# Patient Record
Sex: Male | Born: 1955 | Race: Black or African American | Hispanic: No | Marital: Single | Smoking: Current every day smoker
Health system: Southern US, Community
[De-identification: ages and names within clinical notes are randomized; demographics above are authoritative.]

## PROBLEM LIST (undated history)

## (undated) DIAGNOSIS — F209 Schizophrenia, unspecified: Secondary | ICD-10-CM

## (undated) DIAGNOSIS — I517 Cardiomegaly: Secondary | ICD-10-CM

## (undated) DIAGNOSIS — E785 Hyperlipidemia, unspecified: Secondary | ICD-10-CM

## (undated) DIAGNOSIS — J45909 Unspecified asthma, uncomplicated: Secondary | ICD-10-CM

## (undated) DIAGNOSIS — I10 Essential (primary) hypertension: Secondary | ICD-10-CM

---

## 2015-08-13 DIAGNOSIS — M159 Polyosteoarthritis, unspecified: Secondary | ICD-10-CM | POA: Diagnosis not present

## 2015-08-13 DIAGNOSIS — I10 Essential (primary) hypertension: Secondary | ICD-10-CM | POA: Diagnosis not present

## 2015-08-13 DIAGNOSIS — Z6834 Body mass index (BMI) 34.0-34.9, adult: Secondary | ICD-10-CM | POA: Diagnosis not present

## 2015-08-13 DIAGNOSIS — E119 Type 2 diabetes mellitus without complications: Secondary | ICD-10-CM | POA: Diagnosis not present

## 2015-08-13 DIAGNOSIS — E669 Obesity, unspecified: Secondary | ICD-10-CM | POA: Diagnosis not present

## 2015-08-31 DIAGNOSIS — R7303 Prediabetes: Secondary | ICD-10-CM | POA: Diagnosis not present

## 2015-08-31 DIAGNOSIS — H2513 Age-related nuclear cataract, bilateral: Secondary | ICD-10-CM | POA: Diagnosis not present

## 2015-08-31 DIAGNOSIS — H524 Presbyopia: Secondary | ICD-10-CM | POA: Diagnosis not present

## 2015-10-11 DIAGNOSIS — I10 Essential (primary) hypertension: Secondary | ICD-10-CM | POA: Diagnosis not present

## 2015-10-11 DIAGNOSIS — E119 Type 2 diabetes mellitus without complications: Secondary | ICD-10-CM | POA: Diagnosis not present

## 2015-10-11 DIAGNOSIS — E669 Obesity, unspecified: Secondary | ICD-10-CM | POA: Diagnosis not present

## 2015-10-11 DIAGNOSIS — Z6835 Body mass index (BMI) 35.0-35.9, adult: Secondary | ICD-10-CM | POA: Diagnosis not present

## 2015-10-11 DIAGNOSIS — M159 Polyosteoarthritis, unspecified: Secondary | ICD-10-CM | POA: Diagnosis not present

## 2015-11-13 DIAGNOSIS — M159 Polyosteoarthritis, unspecified: Secondary | ICD-10-CM | POA: Diagnosis not present

## 2015-11-13 DIAGNOSIS — I10 Essential (primary) hypertension: Secondary | ICD-10-CM | POA: Diagnosis not present

## 2015-11-13 DIAGNOSIS — Z1211 Encounter for screening for malignant neoplasm of colon: Secondary | ICD-10-CM | POA: Diagnosis not present

## 2015-11-13 DIAGNOSIS — E669 Obesity, unspecified: Secondary | ICD-10-CM | POA: Diagnosis not present

## 2015-11-13 DIAGNOSIS — E119 Type 2 diabetes mellitus without complications: Secondary | ICD-10-CM | POA: Diagnosis not present

## 2015-11-13 DIAGNOSIS — Z6835 Body mass index (BMI) 35.0-35.9, adult: Secondary | ICD-10-CM | POA: Diagnosis not present

## 2015-11-28 DIAGNOSIS — E119 Type 2 diabetes mellitus without complications: Secondary | ICD-10-CM | POA: Diagnosis not present

## 2015-11-28 DIAGNOSIS — Z1211 Encounter for screening for malignant neoplasm of colon: Secondary | ICD-10-CM | POA: Diagnosis not present

## 2015-11-28 DIAGNOSIS — E669 Obesity, unspecified: Secondary | ICD-10-CM | POA: Diagnosis not present

## 2015-11-28 DIAGNOSIS — I10 Essential (primary) hypertension: Secondary | ICD-10-CM | POA: Diagnosis not present

## 2015-11-28 DIAGNOSIS — Z6835 Body mass index (BMI) 35.0-35.9, adult: Secondary | ICD-10-CM | POA: Diagnosis not present

## 2015-11-28 DIAGNOSIS — M159 Polyosteoarthritis, unspecified: Secondary | ICD-10-CM | POA: Diagnosis not present

## 2015-11-28 DIAGNOSIS — Z01818 Encounter for other preprocedural examination: Secondary | ICD-10-CM | POA: Diagnosis not present

## 2015-12-07 DIAGNOSIS — D128 Benign neoplasm of rectum: Secondary | ICD-10-CM | POA: Diagnosis not present

## 2015-12-07 DIAGNOSIS — E119 Type 2 diabetes mellitus without complications: Secondary | ICD-10-CM | POA: Diagnosis not present

## 2015-12-07 DIAGNOSIS — D122 Benign neoplasm of ascending colon: Secondary | ICD-10-CM | POA: Diagnosis not present

## 2015-12-07 DIAGNOSIS — D126 Benign neoplasm of colon, unspecified: Secondary | ICD-10-CM | POA: Diagnosis not present

## 2015-12-07 DIAGNOSIS — D124 Benign neoplasm of descending colon: Secondary | ICD-10-CM | POA: Diagnosis not present

## 2015-12-07 DIAGNOSIS — Z79899 Other long term (current) drug therapy: Secondary | ICD-10-CM | POA: Diagnosis not present

## 2015-12-07 DIAGNOSIS — Z1211 Encounter for screening for malignant neoplasm of colon: Secondary | ICD-10-CM | POA: Diagnosis not present

## 2015-12-07 DIAGNOSIS — I10 Essential (primary) hypertension: Secondary | ICD-10-CM | POA: Diagnosis not present

## 2015-12-12 DIAGNOSIS — E669 Obesity, unspecified: Secondary | ICD-10-CM | POA: Diagnosis not present

## 2015-12-12 DIAGNOSIS — E119 Type 2 diabetes mellitus without complications: Secondary | ICD-10-CM | POA: Diagnosis not present

## 2015-12-12 DIAGNOSIS — I10 Essential (primary) hypertension: Secondary | ICD-10-CM | POA: Diagnosis not present

## 2015-12-12 DIAGNOSIS — M159 Polyosteoarthritis, unspecified: Secondary | ICD-10-CM | POA: Diagnosis not present

## 2015-12-12 DIAGNOSIS — Z6835 Body mass index (BMI) 35.0-35.9, adult: Secondary | ICD-10-CM | POA: Diagnosis not present

## 2016-02-11 DIAGNOSIS — M159 Polyosteoarthritis, unspecified: Secondary | ICD-10-CM | POA: Diagnosis not present

## 2016-02-11 DIAGNOSIS — E119 Type 2 diabetes mellitus without complications: Secondary | ICD-10-CM | POA: Diagnosis not present

## 2016-02-11 DIAGNOSIS — E669 Obesity, unspecified: Secondary | ICD-10-CM | POA: Diagnosis not present

## 2016-02-11 DIAGNOSIS — Z6835 Body mass index (BMI) 35.0-35.9, adult: Secondary | ICD-10-CM | POA: Diagnosis not present

## 2016-02-11 DIAGNOSIS — E785 Hyperlipidemia, unspecified: Secondary | ICD-10-CM | POA: Diagnosis not present

## 2016-02-11 DIAGNOSIS — I10 Essential (primary) hypertension: Secondary | ICD-10-CM | POA: Diagnosis not present

## 2016-05-13 DIAGNOSIS — M159 Polyosteoarthritis, unspecified: Secondary | ICD-10-CM | POA: Diagnosis not present

## 2016-05-13 DIAGNOSIS — Z6836 Body mass index (BMI) 36.0-36.9, adult: Secondary | ICD-10-CM | POA: Diagnosis not present

## 2016-05-13 DIAGNOSIS — Z23 Encounter for immunization: Secondary | ICD-10-CM | POA: Diagnosis not present

## 2016-05-13 DIAGNOSIS — E669 Obesity, unspecified: Secondary | ICD-10-CM | POA: Diagnosis not present

## 2016-05-13 DIAGNOSIS — E785 Hyperlipidemia, unspecified: Secondary | ICD-10-CM | POA: Diagnosis not present

## 2016-05-13 DIAGNOSIS — E119 Type 2 diabetes mellitus without complications: Secondary | ICD-10-CM | POA: Diagnosis not present

## 2016-05-13 DIAGNOSIS — I10 Essential (primary) hypertension: Secondary | ICD-10-CM | POA: Diagnosis not present

## 2016-08-20 DIAGNOSIS — M159 Polyosteoarthritis, unspecified: Secondary | ICD-10-CM | POA: Diagnosis not present

## 2016-08-20 DIAGNOSIS — E669 Obesity, unspecified: Secondary | ICD-10-CM | POA: Diagnosis not present

## 2016-08-20 DIAGNOSIS — E785 Hyperlipidemia, unspecified: Secondary | ICD-10-CM | POA: Diagnosis not present

## 2016-08-20 DIAGNOSIS — E119 Type 2 diabetes mellitus without complications: Secondary | ICD-10-CM | POA: Diagnosis not present

## 2016-08-20 DIAGNOSIS — I10 Essential (primary) hypertension: Secondary | ICD-10-CM | POA: Diagnosis not present

## 2016-08-20 DIAGNOSIS — Z6836 Body mass index (BMI) 36.0-36.9, adult: Secondary | ICD-10-CM | POA: Diagnosis not present

## 2016-09-12 DIAGNOSIS — H2513 Age-related nuclear cataract, bilateral: Secondary | ICD-10-CM | POA: Diagnosis not present

## 2016-09-12 DIAGNOSIS — E119 Type 2 diabetes mellitus without complications: Secondary | ICD-10-CM | POA: Diagnosis not present

## 2016-09-12 DIAGNOSIS — H524 Presbyopia: Secondary | ICD-10-CM | POA: Diagnosis not present

## 2016-10-15 DIAGNOSIS — I10 Essential (primary) hypertension: Secondary | ICD-10-CM | POA: Diagnosis not present

## 2016-10-15 DIAGNOSIS — E119 Type 2 diabetes mellitus without complications: Secondary | ICD-10-CM | POA: Diagnosis not present

## 2016-10-15 DIAGNOSIS — E785 Hyperlipidemia, unspecified: Secondary | ICD-10-CM | POA: Diagnosis not present

## 2016-10-15 DIAGNOSIS — Z6836 Body mass index (BMI) 36.0-36.9, adult: Secondary | ICD-10-CM | POA: Diagnosis not present

## 2016-10-15 DIAGNOSIS — M159 Polyosteoarthritis, unspecified: Secondary | ICD-10-CM | POA: Diagnosis not present

## 2016-10-15 DIAGNOSIS — Z1389 Encounter for screening for other disorder: Secondary | ICD-10-CM | POA: Diagnosis not present

## 2016-10-15 DIAGNOSIS — E669 Obesity, unspecified: Secondary | ICD-10-CM | POA: Diagnosis not present

## 2017-01-12 DIAGNOSIS — Z6836 Body mass index (BMI) 36.0-36.9, adult: Secondary | ICD-10-CM | POA: Diagnosis not present

## 2017-01-12 DIAGNOSIS — E785 Hyperlipidemia, unspecified: Secondary | ICD-10-CM | POA: Diagnosis not present

## 2017-01-12 DIAGNOSIS — E119 Type 2 diabetes mellitus without complications: Secondary | ICD-10-CM | POA: Diagnosis not present

## 2017-01-12 DIAGNOSIS — I10 Essential (primary) hypertension: Secondary | ICD-10-CM | POA: Diagnosis not present

## 2017-01-12 DIAGNOSIS — E669 Obesity, unspecified: Secondary | ICD-10-CM | POA: Diagnosis not present

## 2017-01-12 DIAGNOSIS — M159 Polyosteoarthritis, unspecified: Secondary | ICD-10-CM | POA: Diagnosis not present

## 2017-02-11 DIAGNOSIS — E785 Hyperlipidemia, unspecified: Secondary | ICD-10-CM | POA: Diagnosis not present

## 2017-02-11 DIAGNOSIS — M159 Polyosteoarthritis, unspecified: Secondary | ICD-10-CM | POA: Diagnosis not present

## 2017-02-11 DIAGNOSIS — Z6836 Body mass index (BMI) 36.0-36.9, adult: Secondary | ICD-10-CM | POA: Diagnosis not present

## 2017-02-11 DIAGNOSIS — I10 Essential (primary) hypertension: Secondary | ICD-10-CM | POA: Diagnosis not present

## 2017-02-11 DIAGNOSIS — E669 Obesity, unspecified: Secondary | ICD-10-CM | POA: Diagnosis not present

## 2017-02-11 DIAGNOSIS — E119 Type 2 diabetes mellitus without complications: Secondary | ICD-10-CM | POA: Diagnosis not present

## 2017-04-14 DIAGNOSIS — M25551 Pain in right hip: Secondary | ICD-10-CM | POA: Diagnosis not present

## 2017-04-14 DIAGNOSIS — I10 Essential (primary) hypertension: Secondary | ICD-10-CM | POA: Diagnosis not present

## 2017-04-14 DIAGNOSIS — E785 Hyperlipidemia, unspecified: Secondary | ICD-10-CM | POA: Diagnosis not present

## 2017-04-14 DIAGNOSIS — E669 Obesity, unspecified: Secondary | ICD-10-CM | POA: Diagnosis not present

## 2017-04-14 DIAGNOSIS — Z6836 Body mass index (BMI) 36.0-36.9, adult: Secondary | ICD-10-CM | POA: Diagnosis not present

## 2017-04-14 DIAGNOSIS — E119 Type 2 diabetes mellitus without complications: Secondary | ICD-10-CM | POA: Diagnosis not present

## 2017-04-14 DIAGNOSIS — M159 Polyosteoarthritis, unspecified: Secondary | ICD-10-CM | POA: Diagnosis not present

## 2017-04-14 DIAGNOSIS — Z23 Encounter for immunization: Secondary | ICD-10-CM | POA: Diagnosis not present

## 2017-07-27 DIAGNOSIS — Z6837 Body mass index (BMI) 37.0-37.9, adult: Secondary | ICD-10-CM | POA: Diagnosis not present

## 2017-07-27 DIAGNOSIS — E785 Hyperlipidemia, unspecified: Secondary | ICD-10-CM | POA: Diagnosis not present

## 2017-07-27 DIAGNOSIS — E119 Type 2 diabetes mellitus without complications: Secondary | ICD-10-CM | POA: Diagnosis not present

## 2017-07-27 DIAGNOSIS — I10 Essential (primary) hypertension: Secondary | ICD-10-CM | POA: Diagnosis not present

## 2017-07-27 DIAGNOSIS — E669 Obesity, unspecified: Secondary | ICD-10-CM | POA: Diagnosis not present

## 2017-07-27 DIAGNOSIS — M159 Polyosteoarthritis, unspecified: Secondary | ICD-10-CM | POA: Diagnosis not present

## 2017-07-27 DIAGNOSIS — J208 Acute bronchitis due to other specified organisms: Secondary | ICD-10-CM | POA: Diagnosis not present

## 2017-08-11 DIAGNOSIS — I10 Essential (primary) hypertension: Secondary | ICD-10-CM | POA: Diagnosis not present

## 2017-08-11 DIAGNOSIS — M159 Polyosteoarthritis, unspecified: Secondary | ICD-10-CM | POA: Diagnosis not present

## 2017-08-11 DIAGNOSIS — Z6837 Body mass index (BMI) 37.0-37.9, adult: Secondary | ICD-10-CM | POA: Diagnosis not present

## 2017-08-11 DIAGNOSIS — E669 Obesity, unspecified: Secondary | ICD-10-CM | POA: Diagnosis not present

## 2017-08-11 DIAGNOSIS — E119 Type 2 diabetes mellitus without complications: Secondary | ICD-10-CM | POA: Diagnosis not present

## 2017-08-11 DIAGNOSIS — E785 Hyperlipidemia, unspecified: Secondary | ICD-10-CM | POA: Diagnosis not present

## 2017-08-11 DIAGNOSIS — R5382 Chronic fatigue, unspecified: Secondary | ICD-10-CM | POA: Diagnosis not present

## 2017-08-11 DIAGNOSIS — N189 Chronic kidney disease, unspecified: Secondary | ICD-10-CM | POA: Diagnosis not present

## 2017-09-14 DIAGNOSIS — H2513 Age-related nuclear cataract, bilateral: Secondary | ICD-10-CM | POA: Diagnosis not present

## 2017-09-14 DIAGNOSIS — H524 Presbyopia: Secondary | ICD-10-CM | POA: Diagnosis not present

## 2017-09-14 DIAGNOSIS — R7303 Prediabetes: Secondary | ICD-10-CM | POA: Diagnosis not present

## 2017-10-26 DIAGNOSIS — N189 Chronic kidney disease, unspecified: Secondary | ICD-10-CM | POA: Diagnosis not present

## 2017-10-26 DIAGNOSIS — E785 Hyperlipidemia, unspecified: Secondary | ICD-10-CM | POA: Diagnosis not present

## 2017-10-26 DIAGNOSIS — E669 Obesity, unspecified: Secondary | ICD-10-CM | POA: Diagnosis not present

## 2017-10-26 DIAGNOSIS — E291 Testicular hypofunction: Secondary | ICD-10-CM | POA: Diagnosis not present

## 2017-10-26 DIAGNOSIS — E119 Type 2 diabetes mellitus without complications: Secondary | ICD-10-CM | POA: Diagnosis not present

## 2017-10-26 DIAGNOSIS — Z6837 Body mass index (BMI) 37.0-37.9, adult: Secondary | ICD-10-CM | POA: Diagnosis not present

## 2017-10-26 DIAGNOSIS — I10 Essential (primary) hypertension: Secondary | ICD-10-CM | POA: Diagnosis not present

## 2017-10-26 DIAGNOSIS — Z1331 Encounter for screening for depression: Secondary | ICD-10-CM | POA: Diagnosis not present

## 2017-10-26 DIAGNOSIS — M159 Polyosteoarthritis, unspecified: Secondary | ICD-10-CM | POA: Diagnosis not present

## 2018-01-27 DIAGNOSIS — E669 Obesity, unspecified: Secondary | ICD-10-CM | POA: Diagnosis not present

## 2018-01-27 DIAGNOSIS — Z6837 Body mass index (BMI) 37.0-37.9, adult: Secondary | ICD-10-CM | POA: Diagnosis not present

## 2018-01-27 DIAGNOSIS — E119 Type 2 diabetes mellitus without complications: Secondary | ICD-10-CM | POA: Diagnosis not present

## 2018-01-27 DIAGNOSIS — M159 Polyosteoarthritis, unspecified: Secondary | ICD-10-CM | POA: Diagnosis not present

## 2018-01-27 DIAGNOSIS — N189 Chronic kidney disease, unspecified: Secondary | ICD-10-CM | POA: Diagnosis not present

## 2018-01-27 DIAGNOSIS — E785 Hyperlipidemia, unspecified: Secondary | ICD-10-CM | POA: Diagnosis not present

## 2018-01-27 DIAGNOSIS — I10 Essential (primary) hypertension: Secondary | ICD-10-CM | POA: Diagnosis not present

## 2018-01-27 DIAGNOSIS — E291 Testicular hypofunction: Secondary | ICD-10-CM | POA: Diagnosis not present

## 2018-02-02 ENCOUNTER — Emergency Department
Admission: EM | Admit: 2018-02-02 | Discharge: 2018-02-02 | Disposition: A | Discharge status: Home / Self Care | Payer: PPO | Attending: Emergency Medicine | Admitting: Emergency Medicine

## 2018-02-02 ENCOUNTER — Other Ambulatory Visit: Payer: Self-pay

## 2018-02-02 DIAGNOSIS — J45909 Unspecified asthma, uncomplicated: Secondary | ICD-10-CM | POA: Insufficient documentation

## 2018-02-02 DIAGNOSIS — E119 Type 2 diabetes mellitus without complications: Secondary | ICD-10-CM | POA: Diagnosis not present

## 2018-02-02 DIAGNOSIS — I10 Essential (primary) hypertension: Secondary | ICD-10-CM | POA: Diagnosis not present

## 2018-02-02 DIAGNOSIS — F17210 Nicotine dependence, cigarettes, uncomplicated: Secondary | ICD-10-CM | POA: Insufficient documentation

## 2018-02-02 DIAGNOSIS — R739 Hyperglycemia, unspecified: Secondary | ICD-10-CM | POA: Diagnosis present

## 2018-02-02 DIAGNOSIS — E1165 Type 2 diabetes mellitus with hyperglycemia: Secondary | ICD-10-CM | POA: Diagnosis not present

## 2018-02-02 HISTORY — DX: Schizophrenia, unspecified: F20.9

## 2018-02-02 HISTORY — DX: Hyperlipidemia, unspecified: E78.5

## 2018-02-02 HISTORY — DX: Unspecified asthma, uncomplicated: J45.909

## 2018-02-02 HISTORY — DX: Cardiomegaly: I51.7

## 2018-02-02 HISTORY — DX: Essential (primary) hypertension: I10

## 2018-02-02 LAB — URINALYSIS, COMPLETE (UACMP) WITH MICROSCOPIC
BACTERIA UA: NONE SEEN
Bilirubin Urine: NEGATIVE
Hgb urine dipstick: NEGATIVE
KETONES UR: 20 mg/dL — AB
Leukocytes, UA: NEGATIVE
Nitrite: NEGATIVE
PH: 6 (ref 5.0–8.0)
Protein, ur: NEGATIVE mg/dL
SPECIFIC GRAVITY, URINE: 1.033 — AB (ref 1.005–1.030)

## 2018-02-02 LAB — BASIC METABOLIC PANEL
Anion gap: 14 (ref 5–15)
BUN: 10 mg/dL (ref 8–23)
CHLORIDE: 94 mmol/L — AB (ref 98–111)
CO2: 24 mmol/L (ref 22–32)
CREATININE: 1.27 mg/dL — AB (ref 0.61–1.24)
Calcium: 10.2 mg/dL (ref 8.9–10.3)
GFR calc Af Amer: >60 mL/min (ref >60)
GFR calc non Af Amer: 59 mL/min — ABNORMAL LOW (ref >60)
GLUCOSE: 547 mg/dL — AB (ref 70–99)
Potassium: 4.5 mmol/L (ref 3.5–5.1)
SODIUM: 132 mmol/L — AB (ref 135–145)

## 2018-02-02 LAB — HEPATIC FUNCTION PANEL
ALBUMIN: 4.1 g/dL (ref 3.5–5.0)
ALT: 19 U/L (ref 0–44)
AST: 25 U/L (ref 15–41)
Alkaline Phosphatase: 117 U/L (ref 38–126)
BILIRUBIN DIRECT: 0.2 mg/dL (ref 0.0–0.2)
BILIRUBIN TOTAL: 1.2 mg/dL (ref 0.3–1.2)
Indirect Bilirubin: 1 mg/dL — ABNORMAL HIGH (ref 0.3–0.9)
Total Protein: 8 g/dL (ref 6.5–8.1)

## 2018-02-02 LAB — CBC
HEMATOCRIT: 43.4 % (ref 40.0–52.0)
Hemoglobin: 14.7 g/dL (ref 13.0–18.0)
MCH: 29.5 pg (ref 26.0–34.0)
MCHC: 33.8 g/dL (ref 32.0–36.0)
MCV: 87.2 fL (ref 80.0–100.0)
PLATELETS: 338 10*3/uL (ref 150–440)
RBC: 4.98 MIL/uL (ref 4.40–5.90)
RDW: 12.9 % (ref 11.5–14.5)
WBC: 9.2 10*3/uL (ref 3.8–10.6)

## 2018-02-02 LAB — GLUCOSE, CAPILLARY
GLUCOSE-CAPILLARY: 348 mg/dL — AB (ref 70–99)
Glucose-Capillary: 408 mg/dL — ABNORMAL HIGH (ref 70–99)

## 2018-02-02 MED ORDER — METFORMIN HCL 500 MG PO TABS: 500.0000 mg | ORAL_TABLET | Freq: Every day | ORAL | 0 refills | Status: AC

## 2018-02-02 MED ORDER — INSULIN ASPART 100 UNIT/ML ~~LOC~~ SOLN
5.0000 [IU] | Freq: Once | SUBCUTANEOUS | Status: AC
  Administered 2018-02-02: 5 [IU] via INTRAVENOUS
  Filled 2018-02-02: qty 1

## 2018-02-02 MED ORDER — SODIUM CHLORIDE 0.9 % IV BOLUS
1000.0000 mL | Freq: Once | INTRAVENOUS | Status: AC
  Administered 2018-02-02: 1000 mL via INTRAVENOUS

## 2018-02-02 NOTE — ED Notes (Signed)
Lab called Marliss CzarLinda McLamb to report BS - 547- Critical Results.  Patient to Room 19. Clinton SawyerKailey RN aware of BS results and room placement.

## 2018-02-02 NOTE — ED Provider Notes (Signed)
[facility] Emergency Department Provider Note ____________________________________________   First MD Initiated Contact with Patient 02/02/18 1056     (approximate)  I have reviewed the triage vital signs and the nursing notes.   HISTORY  Chief Complaint Hyperglycemia  HPI Jay Garrett is a 62 y.o. male presenting from his primary care doctor's office with an elevated glucose in the 500s.  The patient says that he has had associated urinary frequency as well as dry mouth and weakness over the past 2 weeks.  No previous history of diabetes but it does run in his family.  Denies any pain.  He does not report vomiting but is paperwork that accompanied him states that he has been vomiting now for 2 weeks.  Past Medical History:  Diagnosis Date  . Asthma   . Cardiomegaly   . Hyperlipemia   . Hypertension   . Schizophrenia (HCC)     There are no active problems to display for this patient.   History reviewed. No pertinent surgical history.  Prior to Admission medications   Not on File    Allergies Patient has no known allergies.  No family history on file.  Social History Social History   Tobacco Use  . Smoking status: Current Every Day Smoker    Types: Cigarettes  . Smokeless tobacco: Never Used  Substance Use Topics  . Alcohol use: Not Currently  . Drug use: Not Currently    Review of Systems  Constitutional: No fever/chills Eyes: No visual changes. ENT: No sore throat. Cardiovascular: Denies chest pain. Respiratory: Denies shortness of breath. Gastrointestinal: No abdominal pain. No diarrhea.  No constipation. Genitourinary: Negative for dysuria. Musculoskeletal: Negative for back pain. Skin: Negative for rash. Neurological: Negative for headaches, focal weakness or numbness.   ____________________________________________   PHYSICAL EXAM:  VITAL SIGNS: ED Triage Vitals  Enc Vitals Group     BP 02/02/18 1001 (!) 160/88       Pulse Rate 02/02/18 1001 63     Resp 02/02/18 1001 16     Temp 02/02/18 1001 97.7 F (36.5 C)     Temp Source 02/02/18 1001 Oral     SpO2 02/02/18 1001 100 %     Weight 02/02/18 1002 152 lb (68.9 kg)     Height 02/02/18 1002 5' 4.5" (1.638 m)     Head Circumference --      Peak Flow --      Pain Score 02/02/18 1001 0     Pain Loc --      Pain Edu? --      Excl. in [place]? --     Constitutional: Alert and oriented. Well appearing and in no acute distress. Eyes: Conjunctivae are normal.  Head: Atraumatic. Nose: No congestion/rhinnorhea. Mouth/Throat: Mucous membranes are mildly dry. Neck: No stridor.   Cardiovascular: Normal rate, regular rhythm. Grossly normal heart sounds.   Respiratory: Normal respiratory effort.  No retractions. Lungs CTAB. Gastrointestinal: Soft and nontender. No distention. No CVA tenderness. Musculoskeletal: No lower extremity tenderness nor edema.  No joint effusions. Neurologic:  Normal speech and language. No gross focal neurologic deficits are appreciated. Skin:  Skin is warm, dry and intact. No rash noted. Psychiatric: Mood and affect are normal. Speech and behavior are normal.  ____________________________________________   LABS (all labs ordered are listed, but only abnormal results are displayed)  Labs Reviewed  BASIC METABOLIC PANEL - Abnormal; Notable for the following components:      Result Value   Sodium  132 (*)    Chloride 94 (*)    Glucose, Bld 547 (*)    Creatinine, Ser 1.27 (*)    GFR calc non Af Amer 59 (*)    All other components within normal limits  URINALYSIS, COMPLETE (UACMP) WITH MICROSCOPIC - Abnormal; Notable for the following components:   Color, Urine YELLOW (*)    APPearance CLEAR (*)    Specific Gravity, Urine 1.033 (*)    Glucose, UA >=500 (*)    Ketones, ur 20 (*)    All other components within normal limits  HEPATIC FUNCTION PANEL - Abnormal; Notable for the following components:   Indirect Bilirubin 1.0 (*)     All other components within normal limits  GLUCOSE, CAPILLARY - Abnormal; Notable for the following components:   Glucose-Capillary 408 (*)    All other components within normal limits  GLUCOSE, CAPILLARY - Abnormal; Notable for the following components:   Glucose-Capillary 348 (*)    All other components within normal limits  CBC  CBG MONITORING, ED   ____________________________________________  EKG   ____________________________________________  RADIOLOGY   ____________________________________________   PROCEDURES  Procedure(s) performed:   Procedures  Critical Care performed:   ____________________________________________   INITIAL IMPRESSION / ASSESSMENT AND PLAN / ED COURSE  Pertinent labs & imaging results that were available during my care of the patient were reviewed by me and considered in my medical decision making (see chart for details).  DDX: Dehydration, new onset diabetes, DKA, hyperglycemia, renal failure As part of my medical decision making, I reviewed the following data within the electronic MEDICAL RECORD NUMBERNo previous records on file for review  ----------------------------------------- 2:19 PM on 02/02/2018 -----------------------------------------  Patient at this time without complaint.  Tolerating p.o. fluids.  Glucose of 3 5:48 aspart as well as IV fluids.  We will start the patient on metformin, 500 mg daily.  He will be discharged back to his group home at this time to follow-up with his primary care doctor. ____________________________________________   FINAL CLINICAL IMPRESSION(S) / ED DIAGNOSES  Diabetes.  Hyperglycemia.   NEW MEDICATIONS STARTED DURING THIS VISIT:  New Prescriptions   No medications on file     Note:  This document was prepared using Dragon voice recognition software and may include unintentional dictation errors.     Myrna BlazerSchaevitz, Carlisa Eble Matthew, MD 02/02/18 1420

## 2018-02-02 NOTE — ED Triage Notes (Signed)
Pt comes into the ED from [facility] with c/o 32lb wt loss on the past 2 months, with vomiting for the past 2 weeks, generalized weakness. States his CBG 519 with no hx of DM. Pt is here with manager from group home, "[facility]". [phone]/[phone]/Billy.

## 2018-02-02 NOTE — ED Notes (Signed)
First Nurse Note: Patient here with elevated BS - 571 at [facility].  Patient lives at [facility].  Alert.

## 2018-03-16 DIAGNOSIS — Z0000 Encounter for general adult medical examination without abnormal findings: Secondary | ICD-10-CM | POA: Diagnosis not present

## 2018-03-16 DIAGNOSIS — E669 Obesity, unspecified: Secondary | ICD-10-CM | POA: Diagnosis not present

## 2018-03-16 DIAGNOSIS — Z6837 Body mass index (BMI) 37.0-37.9, adult: Secondary | ICD-10-CM | POA: Diagnosis not present

## 2018-03-16 DIAGNOSIS — Z1331 Encounter for screening for depression: Secondary | ICD-10-CM | POA: Diagnosis not present

## 2018-03-16 DIAGNOSIS — Z125 Encounter for screening for malignant neoplasm of prostate: Secondary | ICD-10-CM | POA: Diagnosis not present

## 2018-03-16 DIAGNOSIS — Z1339 Encounter for screening examination for other mental health and behavioral disorders: Secondary | ICD-10-CM | POA: Diagnosis not present

## 2018-03-16 DIAGNOSIS — E785 Hyperlipidemia, unspecified: Secondary | ICD-10-CM | POA: Diagnosis not present

## 2018-05-13 DIAGNOSIS — N189 Chronic kidney disease, unspecified: Secondary | ICD-10-CM | POA: Diagnosis not present

## 2018-05-13 DIAGNOSIS — E785 Hyperlipidemia, unspecified: Secondary | ICD-10-CM | POA: Diagnosis not present

## 2018-05-13 DIAGNOSIS — I10 Essential (primary) hypertension: Secondary | ICD-10-CM | POA: Diagnosis not present

## 2018-05-13 DIAGNOSIS — Z6837 Body mass index (BMI) 37.0-37.9, adult: Secondary | ICD-10-CM | POA: Diagnosis not present

## 2018-05-13 DIAGNOSIS — E291 Testicular hypofunction: Secondary | ICD-10-CM | POA: Diagnosis not present

## 2018-05-13 DIAGNOSIS — E118 Type 2 diabetes mellitus with unspecified complications: Secondary | ICD-10-CM | POA: Diagnosis not present

## 2018-05-13 DIAGNOSIS — M159 Polyosteoarthritis, unspecified: Secondary | ICD-10-CM | POA: Diagnosis not present

## 2018-05-13 DIAGNOSIS — Z23 Encounter for immunization: Secondary | ICD-10-CM | POA: Diagnosis not present

## 2018-05-25 DIAGNOSIS — Z6837 Body mass index (BMI) 37.0-37.9, adult: Secondary | ICD-10-CM | POA: Diagnosis not present

## 2018-05-25 DIAGNOSIS — E785 Hyperlipidemia, unspecified: Secondary | ICD-10-CM | POA: Diagnosis not present

## 2018-05-25 DIAGNOSIS — N189 Chronic kidney disease, unspecified: Secondary | ICD-10-CM | POA: Diagnosis not present

## 2018-05-25 DIAGNOSIS — E291 Testicular hypofunction: Secondary | ICD-10-CM | POA: Diagnosis not present

## 2018-05-25 DIAGNOSIS — M159 Polyosteoarthritis, unspecified: Secondary | ICD-10-CM | POA: Diagnosis not present

## 2018-05-25 DIAGNOSIS — N183 Chronic kidney disease, stage 3 (moderate): Secondary | ICD-10-CM | POA: Diagnosis not present

## 2018-08-11 DIAGNOSIS — Z6837 Body mass index (BMI) 37.0-37.9, adult: Secondary | ICD-10-CM | POA: Diagnosis not present

## 2018-08-11 DIAGNOSIS — J069 Acute upper respiratory infection, unspecified: Secondary | ICD-10-CM | POA: Diagnosis not present

## 2018-08-11 DIAGNOSIS — R42 Dizziness and giddiness: Secondary | ICD-10-CM | POA: Diagnosis not present

## 2018-08-11 DIAGNOSIS — E785 Hyperlipidemia, unspecified: Secondary | ICD-10-CM | POA: Diagnosis not present

## 2018-08-11 DIAGNOSIS — E118 Type 2 diabetes mellitus with unspecified complications: Secondary | ICD-10-CM | POA: Diagnosis not present

## 2018-08-11 DIAGNOSIS — N183 Chronic kidney disease, stage 3 (moderate): Secondary | ICD-10-CM | POA: Diagnosis not present

## 2018-08-11 DIAGNOSIS — E291 Testicular hypofunction: Secondary | ICD-10-CM | POA: Diagnosis not present

## 2018-08-11 DIAGNOSIS — M159 Polyosteoarthritis, unspecified: Secondary | ICD-10-CM | POA: Diagnosis not present

## 2018-08-11 DIAGNOSIS — I10 Essential (primary) hypertension: Secondary | ICD-10-CM | POA: Diagnosis not present

## 2018-08-18 DIAGNOSIS — N183 Chronic kidney disease, stage 3 (moderate): Secondary | ICD-10-CM | POA: Diagnosis not present

## 2018-08-18 DIAGNOSIS — E118 Type 2 diabetes mellitus with unspecified complications: Secondary | ICD-10-CM | POA: Diagnosis not present

## 2018-08-18 DIAGNOSIS — Z6837 Body mass index (BMI) 37.0-37.9, adult: Secondary | ICD-10-CM | POA: Diagnosis not present

## 2018-08-18 DIAGNOSIS — E785 Hyperlipidemia, unspecified: Secondary | ICD-10-CM | POA: Diagnosis not present

## 2018-08-18 DIAGNOSIS — E669 Obesity, unspecified: Secondary | ICD-10-CM | POA: Diagnosis not present

## 2018-08-18 DIAGNOSIS — E291 Testicular hypofunction: Secondary | ICD-10-CM | POA: Diagnosis not present

## 2018-08-18 DIAGNOSIS — M159 Polyosteoarthritis, unspecified: Secondary | ICD-10-CM | POA: Diagnosis not present

## 2018-08-18 DIAGNOSIS — I10 Essential (primary) hypertension: Secondary | ICD-10-CM | POA: Diagnosis not present

## 2018-09-17 DIAGNOSIS — E785 Hyperlipidemia, unspecified: Secondary | ICD-10-CM | POA: Diagnosis not present

## 2018-09-17 DIAGNOSIS — E118 Type 2 diabetes mellitus with unspecified complications: Secondary | ICD-10-CM | POA: Diagnosis not present

## 2018-09-17 DIAGNOSIS — N183 Chronic kidney disease, stage 3 (moderate): Secondary | ICD-10-CM | POA: Diagnosis not present

## 2018-09-17 DIAGNOSIS — M159 Polyosteoarthritis, unspecified: Secondary | ICD-10-CM | POA: Diagnosis not present

## 2018-09-17 DIAGNOSIS — I10 Essential (primary) hypertension: Secondary | ICD-10-CM | POA: Diagnosis not present

## 2018-09-17 DIAGNOSIS — E291 Testicular hypofunction: Secondary | ICD-10-CM | POA: Diagnosis not present

## 2018-09-17 DIAGNOSIS — Z6837 Body mass index (BMI) 37.0-37.9, adult: Secondary | ICD-10-CM | POA: Diagnosis not present

## 2018-10-01 DIAGNOSIS — E118 Type 2 diabetes mellitus with unspecified complications: Secondary | ICD-10-CM | POA: Diagnosis not present

## 2018-10-01 DIAGNOSIS — E785 Hyperlipidemia, unspecified: Secondary | ICD-10-CM | POA: Diagnosis not present

## 2018-10-01 DIAGNOSIS — N183 Chronic kidney disease, stage 3 (moderate): Secondary | ICD-10-CM | POA: Diagnosis not present

## 2018-10-01 DIAGNOSIS — E291 Testicular hypofunction: Secondary | ICD-10-CM | POA: Diagnosis not present

## 2018-10-01 DIAGNOSIS — I10 Essential (primary) hypertension: Secondary | ICD-10-CM | POA: Diagnosis not present

## 2018-10-01 DIAGNOSIS — Z6837 Body mass index (BMI) 37.0-37.9, adult: Secondary | ICD-10-CM | POA: Diagnosis not present

## 2018-10-01 DIAGNOSIS — M159 Polyosteoarthritis, unspecified: Secondary | ICD-10-CM | POA: Diagnosis not present

## 2018-11-03 DIAGNOSIS — I480 Paroxysmal atrial fibrillation: Secondary | ICD-10-CM | POA: Diagnosis not present

## 2018-11-03 DIAGNOSIS — Z6836 Body mass index (BMI) 36.0-36.9, adult: Secondary | ICD-10-CM | POA: Diagnosis not present

## 2018-11-03 DIAGNOSIS — N183 Chronic kidney disease, stage 3 (moderate): Secondary | ICD-10-CM | POA: Diagnosis not present

## 2018-11-03 DIAGNOSIS — Z1212 Encounter for screening for malignant neoplasm of rectum: Secondary | ICD-10-CM | POA: Diagnosis not present

## 2018-11-03 DIAGNOSIS — I10 Essential (primary) hypertension: Secondary | ICD-10-CM | POA: Diagnosis not present

## 2018-11-03 DIAGNOSIS — E785 Hyperlipidemia, unspecified: Secondary | ICD-10-CM | POA: Diagnosis not present

## 2018-11-03 DIAGNOSIS — Z5181 Encounter for therapeutic drug level monitoring: Secondary | ICD-10-CM | POA: Diagnosis not present

## 2018-11-03 DIAGNOSIS — M159 Polyosteoarthritis, unspecified: Secondary | ICD-10-CM | POA: Diagnosis not present

## 2018-11-03 DIAGNOSIS — E118 Type 2 diabetes mellitus with unspecified complications: Secondary | ICD-10-CM | POA: Diagnosis not present

## 2018-11-03 DIAGNOSIS — M25511 Pain in right shoulder: Secondary | ICD-10-CM | POA: Diagnosis not present

## 2018-11-03 DIAGNOSIS — E291 Testicular hypofunction: Secondary | ICD-10-CM | POA: Diagnosis not present

## 2018-11-17 DIAGNOSIS — E118 Type 2 diabetes mellitus with unspecified complications: Secondary | ICD-10-CM | POA: Diagnosis not present

## 2018-11-17 DIAGNOSIS — E291 Testicular hypofunction: Secondary | ICD-10-CM | POA: Diagnosis not present

## 2018-11-17 DIAGNOSIS — E785 Hyperlipidemia, unspecified: Secondary | ICD-10-CM | POA: Diagnosis not present

## 2018-11-17 DIAGNOSIS — Z1331 Encounter for screening for depression: Secondary | ICD-10-CM | POA: Diagnosis not present

## 2018-11-17 DIAGNOSIS — N183 Chronic kidney disease, stage 3 (moderate): Secondary | ICD-10-CM | POA: Diagnosis not present

## 2018-11-17 DIAGNOSIS — I10 Essential (primary) hypertension: Secondary | ICD-10-CM | POA: Diagnosis not present

## 2018-11-17 DIAGNOSIS — M159 Polyosteoarthritis, unspecified: Secondary | ICD-10-CM | POA: Diagnosis not present

## 2018-11-17 DIAGNOSIS — Z6836 Body mass index (BMI) 36.0-36.9, adult: Secondary | ICD-10-CM | POA: Diagnosis not present

## 2018-12-07 DIAGNOSIS — E291 Testicular hypofunction: Secondary | ICD-10-CM | POA: Diagnosis not present

## 2018-12-07 DIAGNOSIS — E118 Type 2 diabetes mellitus with unspecified complications: Secondary | ICD-10-CM | POA: Diagnosis not present

## 2018-12-07 DIAGNOSIS — N183 Chronic kidney disease, stage 3 (moderate): Secondary | ICD-10-CM | POA: Diagnosis not present

## 2018-12-07 DIAGNOSIS — M25511 Pain in right shoulder: Secondary | ICD-10-CM | POA: Diagnosis not present

## 2018-12-07 DIAGNOSIS — I10 Essential (primary) hypertension: Secondary | ICD-10-CM | POA: Diagnosis not present

## 2018-12-07 DIAGNOSIS — E785 Hyperlipidemia, unspecified: Secondary | ICD-10-CM | POA: Diagnosis not present

## 2018-12-07 DIAGNOSIS — Z6835 Body mass index (BMI) 35.0-35.9, adult: Secondary | ICD-10-CM | POA: Diagnosis not present

## 2018-12-07 DIAGNOSIS — M159 Polyosteoarthritis, unspecified: Secondary | ICD-10-CM | POA: Diagnosis not present

## 2019-02-07 DIAGNOSIS — E118 Type 2 diabetes mellitus with unspecified complications: Secondary | ICD-10-CM | POA: Diagnosis not present

## 2019-02-07 DIAGNOSIS — E785 Hyperlipidemia, unspecified: Secondary | ICD-10-CM | POA: Diagnosis not present

## 2019-02-07 DIAGNOSIS — M159 Polyosteoarthritis, unspecified: Secondary | ICD-10-CM | POA: Diagnosis not present

## 2019-02-07 DIAGNOSIS — Z6836 Body mass index (BMI) 36.0-36.9, adult: Secondary | ICD-10-CM | POA: Diagnosis not present

## 2019-02-07 DIAGNOSIS — N183 Chronic kidney disease, stage 3 (moderate): Secondary | ICD-10-CM | POA: Diagnosis not present

## 2019-02-07 DIAGNOSIS — M25511 Pain in right shoulder: Secondary | ICD-10-CM | POA: Diagnosis not present

## 2019-02-07 DIAGNOSIS — I10 Essential (primary) hypertension: Secondary | ICD-10-CM | POA: Diagnosis not present

## 2019-02-07 DIAGNOSIS — E291 Testicular hypofunction: Secondary | ICD-10-CM | POA: Diagnosis not present

## 2019-03-22 DIAGNOSIS — E785 Hyperlipidemia, unspecified: Secondary | ICD-10-CM | POA: Diagnosis not present

## 2019-03-22 DIAGNOSIS — Z125 Encounter for screening for malignant neoplasm of prostate: Secondary | ICD-10-CM | POA: Diagnosis not present

## 2019-03-22 DIAGNOSIS — Z1331 Encounter for screening for depression: Secondary | ICD-10-CM | POA: Diagnosis not present

## 2019-03-22 DIAGNOSIS — Z0000 Encounter for general adult medical examination without abnormal findings: Secondary | ICD-10-CM | POA: Diagnosis not present

## 2019-03-22 DIAGNOSIS — Z6836 Body mass index (BMI) 36.0-36.9, adult: Secondary | ICD-10-CM | POA: Diagnosis not present

## 2019-03-22 DIAGNOSIS — Z9181 History of falling: Secondary | ICD-10-CM | POA: Diagnosis not present

## 2019-04-11 DIAGNOSIS — I10 Essential (primary) hypertension: Secondary | ICD-10-CM | POA: Diagnosis not present

## 2019-04-11 DIAGNOSIS — Z1331 Encounter for screening for depression: Secondary | ICD-10-CM | POA: Diagnosis not present

## 2019-04-11 DIAGNOSIS — E785 Hyperlipidemia, unspecified: Secondary | ICD-10-CM | POA: Diagnosis not present

## 2019-04-11 DIAGNOSIS — N183 Chronic kidney disease, stage 3 (moderate): Secondary | ICD-10-CM | POA: Diagnosis not present

## 2019-04-11 DIAGNOSIS — M159 Polyosteoarthritis, unspecified: Secondary | ICD-10-CM | POA: Diagnosis not present

## 2019-04-11 DIAGNOSIS — E291 Testicular hypofunction: Secondary | ICD-10-CM | POA: Diagnosis not present

## 2019-04-11 DIAGNOSIS — Z23 Encounter for immunization: Secondary | ICD-10-CM | POA: Diagnosis not present

## 2019-04-11 DIAGNOSIS — E118 Type 2 diabetes mellitus with unspecified complications: Secondary | ICD-10-CM | POA: Diagnosis not present

## 2019-04-11 DIAGNOSIS — Z6836 Body mass index (BMI) 36.0-36.9, adult: Secondary | ICD-10-CM | POA: Diagnosis not present

## 2019-06-10 DIAGNOSIS — E291 Testicular hypofunction: Secondary | ICD-10-CM | POA: Diagnosis not present

## 2019-06-10 DIAGNOSIS — E118 Type 2 diabetes mellitus with unspecified complications: Secondary | ICD-10-CM | POA: Diagnosis not present

## 2019-06-10 DIAGNOSIS — N529 Male erectile dysfunction, unspecified: Secondary | ICD-10-CM | POA: Diagnosis not present

## 2019-06-10 DIAGNOSIS — Z6836 Body mass index (BMI) 36.0-36.9, adult: Secondary | ICD-10-CM | POA: Diagnosis not present

## 2019-06-10 DIAGNOSIS — R6889 Other general symptoms and signs: Secondary | ICD-10-CM | POA: Diagnosis not present

## 2019-06-10 DIAGNOSIS — E785 Hyperlipidemia, unspecified: Secondary | ICD-10-CM | POA: Diagnosis not present

## 2019-06-10 DIAGNOSIS — Z20828 Contact with and (suspected) exposure to other viral communicable diseases: Secondary | ICD-10-CM | POA: Diagnosis not present

## 2019-06-10 DIAGNOSIS — I10 Essential (primary) hypertension: Secondary | ICD-10-CM | POA: Diagnosis not present

## 2019-06-10 DIAGNOSIS — M159 Polyosteoarthritis, unspecified: Secondary | ICD-10-CM | POA: Diagnosis not present

## 2019-06-10 DIAGNOSIS — N183 Chronic kidney disease, stage 3 unspecified: Secondary | ICD-10-CM | POA: Diagnosis not present

## 2019-06-14 DIAGNOSIS — U071 COVID-19: Secondary | ICD-10-CM | POA: Diagnosis not present

## 2019-06-14 DIAGNOSIS — Z6836 Body mass index (BMI) 36.0-36.9, adult: Secondary | ICD-10-CM | POA: Diagnosis not present

## 2019-06-14 DIAGNOSIS — I10 Essential (primary) hypertension: Secondary | ICD-10-CM | POA: Diagnosis not present

## 2019-06-14 DIAGNOSIS — E118 Type 2 diabetes mellitus with unspecified complications: Secondary | ICD-10-CM | POA: Diagnosis not present

## 2019-06-14 DIAGNOSIS — E785 Hyperlipidemia, unspecified: Secondary | ICD-10-CM | POA: Diagnosis not present

## 2019-06-14 DIAGNOSIS — N183 Chronic kidney disease, stage 3 unspecified: Secondary | ICD-10-CM | POA: Diagnosis not present

## 2019-06-14 DIAGNOSIS — E291 Testicular hypofunction: Secondary | ICD-10-CM | POA: Diagnosis not present

## 2019-06-14 DIAGNOSIS — M159 Polyosteoarthritis, unspecified: Secondary | ICD-10-CM | POA: Diagnosis not present

## 2019-06-14 DIAGNOSIS — N529 Male erectile dysfunction, unspecified: Secondary | ICD-10-CM | POA: Diagnosis not present

## 2019-06-28 DIAGNOSIS — U071 COVID-19: Secondary | ICD-10-CM | POA: Diagnosis not present

## 2019-06-28 DIAGNOSIS — M159 Polyosteoarthritis, unspecified: Secondary | ICD-10-CM | POA: Diagnosis not present

## 2019-06-28 DIAGNOSIS — N529 Male erectile dysfunction, unspecified: Secondary | ICD-10-CM | POA: Diagnosis not present

## 2019-06-28 DIAGNOSIS — E785 Hyperlipidemia, unspecified: Secondary | ICD-10-CM | POA: Diagnosis not present

## 2019-06-28 DIAGNOSIS — Z6836 Body mass index (BMI) 36.0-36.9, adult: Secondary | ICD-10-CM | POA: Diagnosis not present

## 2019-06-28 DIAGNOSIS — E291 Testicular hypofunction: Secondary | ICD-10-CM | POA: Diagnosis not present

## 2019-06-28 DIAGNOSIS — E118 Type 2 diabetes mellitus with unspecified complications: Secondary | ICD-10-CM | POA: Diagnosis not present

## 2019-06-28 DIAGNOSIS — I10 Essential (primary) hypertension: Secondary | ICD-10-CM | POA: Diagnosis not present

## 2019-06-28 DIAGNOSIS — N183 Chronic kidney disease, stage 3 unspecified: Secondary | ICD-10-CM | POA: Diagnosis not present

## 2019-07-12 DIAGNOSIS — N529 Male erectile dysfunction, unspecified: Secondary | ICD-10-CM | POA: Diagnosis not present

## 2019-07-12 DIAGNOSIS — Z6836 Body mass index (BMI) 36.0-36.9, adult: Secondary | ICD-10-CM | POA: Diagnosis not present

## 2019-07-12 DIAGNOSIS — N1831 Chronic kidney disease, stage 3a: Secondary | ICD-10-CM | POA: Diagnosis not present

## 2019-07-12 DIAGNOSIS — E291 Testicular hypofunction: Secondary | ICD-10-CM | POA: Diagnosis not present

## 2019-07-12 DIAGNOSIS — E118 Type 2 diabetes mellitus with unspecified complications: Secondary | ICD-10-CM | POA: Diagnosis not present

## 2019-07-12 DIAGNOSIS — I10 Essential (primary) hypertension: Secondary | ICD-10-CM | POA: Diagnosis not present

## 2019-07-12 DIAGNOSIS — E669 Obesity, unspecified: Secondary | ICD-10-CM | POA: Diagnosis not present

## 2019-07-12 DIAGNOSIS — E785 Hyperlipidemia, unspecified: Secondary | ICD-10-CM | POA: Diagnosis not present

## 2019-07-12 DIAGNOSIS — M159 Polyosteoarthritis, unspecified: Secondary | ICD-10-CM | POA: Diagnosis not present

## 2019-11-25 ENCOUNTER — Other Ambulatory Visit: Payer: Self-pay

## 2019-11-25 ENCOUNTER — Observation Stay
Admission: EM | Admit: 2019-11-25 | Discharge: 2019-11-27 | Disposition: A | Discharge status: Home / Self Care | Payer: PPO | Attending: Internal Medicine | Admitting: Internal Medicine

## 2019-11-25 ENCOUNTER — Observation Stay (HOSPITAL_BASED_OUTPATIENT_CLINIC_OR_DEPARTMENT_OTHER)
Admit: 2019-11-25 | Discharge: 2019-11-25 | Disposition: A | Discharge status: Home / Self Care | Payer: PPO | Attending: Internal Medicine | Admitting: Internal Medicine

## 2019-11-25 ENCOUNTER — Emergency Department: Payer: PPO

## 2019-11-25 DIAGNOSIS — Z79899 Other long term (current) drug therapy: Secondary | ICD-10-CM | POA: Insufficient documentation

## 2019-11-25 DIAGNOSIS — Z8673 Personal history of transient ischemic attack (TIA), and cerebral infarction without residual deficits: Secondary | ICD-10-CM | POA: Insufficient documentation

## 2019-11-25 DIAGNOSIS — E1129 Type 2 diabetes mellitus with other diabetic kidney complication: Secondary | ICD-10-CM | POA: Diagnosis present

## 2019-11-25 DIAGNOSIS — I10 Essential (primary) hypertension: Secondary | ICD-10-CM | POA: Diagnosis present

## 2019-11-25 DIAGNOSIS — E785 Hyperlipidemia, unspecified: Secondary | ICD-10-CM | POA: Insufficient documentation

## 2019-11-25 DIAGNOSIS — E1121 Type 2 diabetes mellitus with diabetic nephropathy: Secondary | ICD-10-CM | POA: Insufficient documentation

## 2019-11-25 DIAGNOSIS — G459 Transient cerebral ischemic attack, unspecified: Principal | ICD-10-CM | POA: Insufficient documentation

## 2019-11-25 DIAGNOSIS — N179 Acute kidney failure, unspecified: Secondary | ICD-10-CM | POA: Diagnosis not present

## 2019-11-25 DIAGNOSIS — G9341 Metabolic encephalopathy: Secondary | ICD-10-CM | POA: Diagnosis present

## 2019-11-25 DIAGNOSIS — F17210 Nicotine dependence, cigarettes, uncomplicated: Secondary | ICD-10-CM | POA: Diagnosis not present

## 2019-11-25 DIAGNOSIS — N1831 Chronic kidney disease, stage 3a: Secondary | ICD-10-CM | POA: Diagnosis not present

## 2019-11-25 DIAGNOSIS — I1310 Hypertensive heart and chronic kidney disease without heart failure, with stage 1 through stage 4 chronic kidney disease, or unspecified chronic kidney disease: Secondary | ICD-10-CM | POA: Diagnosis not present

## 2019-11-25 DIAGNOSIS — R4781 Slurred speech: Secondary | ICD-10-CM | POA: Diagnosis present

## 2019-11-25 DIAGNOSIS — F209 Schizophrenia, unspecified: Secondary | ICD-10-CM | POA: Insufficient documentation

## 2019-11-25 DIAGNOSIS — R29810 Facial weakness: Secondary | ICD-10-CM | POA: Diagnosis present

## 2019-11-25 DIAGNOSIS — J45909 Unspecified asthma, uncomplicated: Secondary | ICD-10-CM | POA: Diagnosis not present

## 2019-11-25 DIAGNOSIS — Z7984 Long term (current) use of oral hypoglycemic drugs: Secondary | ICD-10-CM | POA: Diagnosis not present

## 2019-11-25 DIAGNOSIS — Z7982 Long term (current) use of aspirin: Secondary | ICD-10-CM | POA: Diagnosis not present

## 2019-11-25 DIAGNOSIS — Z791 Long term (current) use of non-steroidal anti-inflammatories (NSAID): Secondary | ICD-10-CM | POA: Insufficient documentation

## 2019-11-25 DIAGNOSIS — I351 Nonrheumatic aortic (valve) insufficiency: Secondary | ICD-10-CM | POA: Diagnosis not present

## 2019-11-25 DIAGNOSIS — R4182 Altered mental status, unspecified: Secondary | ICD-10-CM | POA: Diagnosis present

## 2019-11-25 DIAGNOSIS — Z20822 Contact with and (suspected) exposure to covid-19: Secondary | ICD-10-CM | POA: Insufficient documentation

## 2019-11-25 DIAGNOSIS — Z03818 Encounter for observation for suspected exposure to other biological agents ruled out: Secondary | ICD-10-CM | POA: Diagnosis not present

## 2019-11-25 DIAGNOSIS — R404 Transient alteration of awareness: Secondary | ICD-10-CM | POA: Diagnosis not present

## 2019-11-25 DIAGNOSIS — R410 Disorientation, unspecified: Secondary | ICD-10-CM | POA: Diagnosis not present

## 2019-11-25 DIAGNOSIS — R0902 Hypoxemia: Secondary | ICD-10-CM | POA: Diagnosis not present

## 2019-11-25 DIAGNOSIS — R42 Dizziness and giddiness: Secondary | ICD-10-CM | POA: Diagnosis not present

## 2019-11-25 LAB — CBC WITH DIFFERENTIAL/PLATELET
Abs Immature Granulocytes: 0.04 K/uL (ref 0.00–0.07)
Basophils Absolute: 0.1 K/uL (ref 0.0–0.1)
Basophils Relative: 1 %
Eosinophils Absolute: 0.1 K/uL (ref 0.0–0.5)
Eosinophils Relative: 1 %
HCT: 50.2 % (ref 39.0–52.0)
Hemoglobin: 15.9 g/dL (ref 13.0–17.0)
Immature Granulocytes: 0 %
Lymphocytes Relative: 33 %
Lymphs Abs: 3.6 K/uL (ref 0.7–4.0)
MCH: 29.1 pg (ref 26.0–34.0)
MCHC: 31.7 g/dL (ref 30.0–36.0)
MCV: 91.9 fL (ref 80.0–100.0)
Monocytes Absolute: 0.7 K/uL (ref 0.1–1.0)
Monocytes Relative: 6 %
Neutro Abs: 6.4 K/uL (ref 1.7–7.7)
Neutrophils Relative %: 59 %
Platelets: 249 K/uL (ref 150–400)
RBC: 5.46 MIL/uL (ref 4.22–5.81)
RDW: 13.5 % (ref 11.5–15.5)
WBC: 10.9 K/uL — ABNORMAL HIGH (ref 4.0–10.5)
nRBC: 0 % (ref 0.0–0.2)

## 2019-11-25 LAB — BASIC METABOLIC PANEL WITH GFR
Anion gap: 12 (ref 5–15)
BUN: 11 mg/dL (ref 8–23)
CO2: 24 mmol/L (ref 22–32)
Calcium: 9.6 mg/dL (ref 8.9–10.3)
Chloride: 104 mmol/L (ref 98–111)
Creatinine, Ser: 1.42 mg/dL — ABNORMAL HIGH (ref 0.61–1.24)
GFR calc Af Amer: >60 mL/min
GFR calc non Af Amer: 52 mL/min — ABNORMAL LOW
Glucose, Bld: 127 mg/dL — ABNORMAL HIGH (ref 70–99)
Potassium: 4.8 mmol/L (ref 3.5–5.1)
Sodium: 140 mmol/L (ref 135–145)

## 2019-11-25 LAB — GLUCOSE, CAPILLARY
Glucose-Capillary: 91 mg/dL (ref 70–99)
Glucose-Capillary: 85 mg/dL (ref 70–99)
Glucose-Capillary: 87 mg/dL (ref 70–99)

## 2019-11-25 LAB — TROPONIN I (HIGH SENSITIVITY): Troponin I (High Sensitivity): 6 ng/L (ref <18)

## 2019-11-25 LAB — ECHOCARDIOGRAM COMPLETE
Height: 66 in
Weight: 2720 oz

## 2019-11-25 LAB — BRAIN NATRIURETIC PEPTIDE: B Natriuretic Peptide: 42 pg/mL (ref 0.0–100.0)

## 2019-11-25 LAB — SARS CORONAVIRUS 2 (TAT 6-24 HRS): SARS Coronavirus 2: NEGATIVE

## 2019-11-25 MED ORDER — ACETAMINOPHEN 325 MG PO TABS: 650.0000 mg | ORAL_TABLET | Freq: Four times a day (QID) | ORAL | Status: DC | PRN

## 2019-11-25 MED ORDER — ALBUTEROL SULFATE (2.5 MG/3ML) 0.083% IN NEBU
2.5000 mg | INHALATION_SOLUTION | RESPIRATORY_TRACT | Status: DC | PRN
  Filled 2019-11-25 (×2): qty 3

## 2019-11-25 MED ORDER — DIVALPROEX SODIUM 500 MG PO DR TAB
500.0000 mg | DELAYED_RELEASE_TABLET | Freq: Every day | ORAL | Status: DC
  Administered 2019-11-25 – 2019-11-26 (×2): 500 mg via ORAL
  Filled 2019-11-25 (×3): qty 1

## 2019-11-25 MED ORDER — FLUTICASONE PROPIONATE HFA 220 MCG/ACT IN AERO: 2.0000 | INHALATION_SPRAY | Freq: Two times a day (BID) | RESPIRATORY_TRACT | Status: DC

## 2019-11-25 MED ORDER — BUDESONIDE 0.5 MG/2ML IN SUSP
0.5000 mg | Freq: Two times a day (BID) | RESPIRATORY_TRACT | Status: DC
  Administered 2019-11-26 – 2019-11-27 (×3): 0.5 mg via RESPIRATORY_TRACT
  Filled 2019-11-25 (×3): qty 2

## 2019-11-25 MED ORDER — SODIUM CHLORIDE 0.9 % IV SOLN: INTRAVENOUS | Status: DC

## 2019-11-25 MED ORDER — BUPROPION HCL ER (XL) 150 MG PO TB24
150.0000 mg | ORAL_TABLET | Freq: Every day | ORAL | Status: DC
  Administered 2019-11-26 – 2019-11-27 (×2): 150 mg via ORAL
  Filled 2019-11-25 (×2): qty 1

## 2019-11-25 MED ORDER — PANTOPRAZOLE SODIUM 40 MG PO TBEC
40.0000 mg | DELAYED_RELEASE_TABLET | Freq: Every day | ORAL | Status: DC
  Administered 2019-11-25 – 2019-11-27 (×3): 40 mg via ORAL
  Filled 2019-11-25 (×3): qty 1

## 2019-11-25 MED ORDER — LORATADINE 10 MG PO TABS
10.0000 mg | ORAL_TABLET | Freq: Every day | ORAL | Status: DC
  Administered 2019-11-26 – 2019-11-27 (×2): 10 mg via ORAL
  Filled 2019-11-25 (×2): qty 1

## 2019-11-25 MED ORDER — DIVALPROEX SODIUM 250 MG PO DR TAB
250.0000 mg | DELAYED_RELEASE_TABLET | Freq: Every morning | ORAL | Status: DC
  Administered 2019-11-26 – 2019-11-27 (×2): 250 mg via ORAL
  Filled 2019-11-25 (×2): qty 1

## 2019-11-25 MED ORDER — SENNOSIDES-DOCUSATE SODIUM 8.6-50 MG PO TABS: 1.0000 | ORAL_TABLET | Freq: Every evening | ORAL | Status: DC | PRN

## 2019-11-25 MED ORDER — ATORVASTATIN CALCIUM 20 MG PO TABS
40.0000 mg | ORAL_TABLET | Freq: Every day | ORAL | Status: DC
  Administered 2019-11-25 – 2019-11-26 (×2): 40 mg via ORAL
  Filled 2019-11-25 (×2): qty 2

## 2019-11-25 MED ORDER — DIVALPROEX SODIUM 250 MG PO DR TAB
250.0000 mg | DELAYED_RELEASE_TABLET | Freq: Two times a day (BID) | ORAL | Status: DC
  Filled 2019-11-25: qty 2

## 2019-11-25 MED ORDER — NICOTINE 21 MG/24HR TD PT24
21.0000 mg | MEDICATED_PATCH | Freq: Every day | TRANSDERMAL | Status: DC
  Filled 2019-11-25: qty 1

## 2019-11-25 MED ORDER — MELOXICAM 7.5 MG PO TABS
15.0000 mg | ORAL_TABLET | Freq: Every day | ORAL | Status: DC
  Administered 2019-11-26 – 2019-11-27 (×2): 15 mg via ORAL
  Filled 2019-11-25 (×2): qty 2

## 2019-11-25 MED ORDER — HYDRALAZINE HCL 20 MG/ML IJ SOLN
5.0000 mg | INTRAMUSCULAR | Status: DC | PRN
  Administered 2019-11-25 – 2019-11-26 (×3): 5 mg via INTRAVENOUS
  Filled 2019-11-25 (×3): qty 1

## 2019-11-25 MED ORDER — ONDANSETRON HCL 4 MG/2ML IJ SOLN: 4.0000 mg | Freq: Three times a day (TID) | INTRAMUSCULAR | Status: DC | PRN

## 2019-11-25 MED ORDER — STROKE: EARLY STAGES OF RECOVERY BOOK: Freq: Once | Status: AC

## 2019-11-25 MED ORDER — PROPRANOLOL HCL ER 80 MG PO CP24
80.0000 mg | ORAL_CAPSULE | Freq: Every day | ORAL | Status: DC
  Administered 2019-11-27: 80 mg via ORAL
  Filled 2019-11-25 (×3): qty 1

## 2019-11-25 MED ORDER — INSULIN ASPART 100 UNIT/ML ~~LOC~~ SOLN: 0.0000 [IU] | Freq: Three times a day (TID) | SUBCUTANEOUS | Status: DC

## 2019-11-25 MED ORDER — ASPIRIN EC 81 MG PO TBEC
81.0000 mg | DELAYED_RELEASE_TABLET | Freq: Every day | ORAL | Status: DC
  Administered 2019-11-26 – 2019-11-27 (×2): 81 mg via ORAL
  Filled 2019-11-25 (×2): qty 1

## 2019-11-25 MED ORDER — INSULIN ASPART 100 UNIT/ML ~~LOC~~ SOLN: 0.0000 [IU] | Freq: Every day | SUBCUTANEOUS | Status: DC

## 2019-11-25 MED ORDER — ENOXAPARIN SODIUM 40 MG/0.4ML ~~LOC~~ SOLN
40.0000 mg | SUBCUTANEOUS | Status: DC
  Administered 2019-11-25 – 2019-11-26 (×2): 40 mg via SUBCUTANEOUS
  Filled 2019-11-25 (×2): qty 0.4

## 2019-11-25 MED ORDER — VITAMIN D 25 MCG (1000 UNIT) PO TABS
5000.0000 [IU] | ORAL_TABLET | Freq: Every day | ORAL | Status: DC
  Administered 2019-11-26 – 2019-11-27 (×2): 5000 [IU] via ORAL
  Filled 2019-11-25 (×2): qty 5

## 2019-11-25 MED ORDER — MONTELUKAST SODIUM 10 MG PO TABS
10.0000 mg | ORAL_TABLET | Freq: Every day | ORAL | Status: DC
  Administered 2019-11-26: 21:00:00 10 mg via ORAL
  Filled 2019-11-25: qty 1

## 2019-11-25 MED ORDER — LURASIDONE HCL 40 MG PO TABS
60.0000 mg | ORAL_TABLET | Freq: Every day | ORAL | Status: DC
  Administered 2019-11-25 – 2019-11-26 (×2): 60 mg via ORAL
  Filled 2019-11-25 (×3): qty 2

## 2019-11-25 NOTE — ED Provider Notes (Signed)
[facility] Emergency Department Provider Note   ____________________________________________   First MD Initiated Contact with Patient 11/25/19 0802     (approximate)  I have reviewed the triage vital signs and the nursing notes.   HISTORY  Chief Complaint Altered Mental Status    HPI Jay Garrett is a 64 y.o. male with possible history of hypertension, hyperlipidemia, asthma, and schizophrenia who presents to the ED for altered mental status.  Per EMS, staff at patient's group home noted him to be slightly confused with slurred speech and a facial droop starting this morning.  His last known well was 8 PM last night and EMS did not notice any deficits with their initial evaluation.  Patient states that he was feeling lightheaded and dizzy this morning, but he does not recall any numbness, weakness, difficulties with his speech or vision.  He states he feels back to normal and has been feeling well in general recently with no fevers, cough, chest pain, or shortness of breath.        Past Medical History:  Diagnosis Date  . Asthma   . Cardiomegaly   . Hyperlipemia   . Hypertension   . Schizophrenia ([place])     There are no problems to display for this patient.   History reviewed. No pertinent surgical history.  Prior to Admission medications   Medication Sig Start Date End Date Taking? Authorizing Provider  metFORMIN (GLUCOPHAGE) 500 MG tablet Take 1 tablet (500 mg total) by mouth daily with breakfast. 02/02/18 02/02/19  Schaevitz, Randall An, MD    Allergies Patient has no known allergies.  No family history on file.  Social History Social History   Tobacco Use  . Smoking status: Current Every Day Smoker    Types: Cigarettes  . Smokeless tobacco: Never Used  Substance Use Topics  . Alcohol use: Not Currently  . Drug use: Not Currently    Review of Systems  Constitutional: No fever/chills.  Positive for dizziness and  lightheadedness. Eyes: No visual changes. ENT: No sore throat. Cardiovascular: Denies chest pain. Respiratory: Denies shortness of breath. Gastrointestinal: No abdominal pain.  No nausea, no vomiting.  No diarrhea.  No constipation. Genitourinary: Negative for dysuria. Musculoskeletal: Negative for back pain. Skin: Negative for rash. Neurological: Negative for headaches, focal weakness or numbness.  ____________________________________________   PHYSICAL EXAM:  VITAL SIGNS: ED Triage Vitals  Enc Vitals Group     BP      Pulse      Resp      Temp      Temp src      SpO2      Weight      Height      Head Circumference      Peak Flow      Pain Score      Pain Loc      Pain Edu?      Excl. in [place]?     Constitutional: Alert and oriented to person and place, but not time. Eyes: Conjunctivae are normal. Head: Atraumatic. Nose: No congestion/rhinnorhea. Mouth/Throat: Mucous membranes are moist. Neck: Normal ROM Cardiovascular: Normal rate, regular rhythm. Grossly normal heart sounds. Respiratory: Normal respiratory effort.  No retractions. Lungs CTAB. Gastrointestinal: Soft and nontender. No distention. Genitourinary: deferred Musculoskeletal: No lower extremity tenderness nor edema. Neurologic:  Normal speech and language. No gross focal neurologic deficits are appreciated. Skin:  Skin is warm, dry and intact. No rash noted. Psychiatric: Mood and affect are normal. Speech  and behavior are normal.  ____________________________________________   LABS (all labs ordered are listed, but only abnormal results are displayed)  Labs Reviewed  BASIC METABOLIC PANEL - Abnormal; Notable for the following components:      Result Value   Glucose, Bld 127 (*)    Creatinine, Ser 1.42 (*)    GFR calc non Af Amer 52 (*)    All other components within normal limits  CBC WITH DIFFERENTIAL/PLATELET - Abnormal; Notable for the following components:   WBC 10.9 (*)    All other  components within normal limits  SARS CORONAVIRUS 2 (TAT 6-24 HRS)  TROPONIN I (HIGH SENSITIVITY)   ____________________________________________  EKG  ED ECG REPORT I, Chesley Noon, the attending physician, personally viewed and interpreted this ECG.   Date: 11/25/2019  EKG Time: 8:02  Rate: 77  Rhythm: normal sinus rhythm  Axis: Normal  Intervals:none  ST&T Change: None   PROCEDURES  Procedure(s) performed (including Critical Care):  Procedures   ____________________________________________   INITIAL IMPRESSION / ASSESSMENT AND PLAN / ED COURSE       64 year old male with history of hypertension, hyperlipidemia, asthma, and schizophrenia presents to the ED for confusion, slurred speech, and facial droop noted this morning by group home staff.  Upon arrival in the ED, he has no focal neurologic deficits and states that all symptoms have resolved.  He only reports feeling dizzy and lightheaded earlier and stroke seems unlikely at this point.  He would also not be a candidate for TPA given last known well at 8 PM last night.  We will further assess with CT head as well as EKG and labs.  No evidence of infectious process.  CT head is negative for acute process and lab work is unremarkable.  His neurologic symptoms remain resolved, however given his multiple risk factors for stroke and presentation concerning for TIA, we will admit for TIA workup.  Case discussed with hospitalist for admission.      ____________________________________________   FINAL CLINICAL IMPRESSION(S) / ED DIAGNOSES  Final diagnoses:  TIA (transient ischemic attack)     ED Discharge Orders    None       Note:  This document was prepared using Dragon voice recognition software and may include unintentional dictation errors.   Chesley Noon, MD 11/25/19 [phone]

## 2019-11-25 NOTE — ED Notes (Signed)
IV team at bedside 

## 2019-11-25 NOTE — ED Notes (Signed)
Called dietary to get tray sent for pt.

## 2019-11-25 NOTE — ED Notes (Signed)
Pt has mealtray at bedside  

## 2019-11-25 NOTE — Consult Note (Signed)
Reason for Consult: L facial droop and confusion  Requesting Physician: Dr. Clyde Lundborg  CC: L facial droop and confusion    HPI: Jay Garrett is an 64 y.o. male  with medical history significant of hypertension, hyperlipidemia, diabetes mellitus, asthma, TIA, schizophrenia, cardiomegaly, tobacco abuse, CKD stage III, who presents with altered mental status, facial droop, slurred speech and dizziness from group home.  Patient was last known normal at 8 PM last night, and was noted to have left facial droop, slurred speech, confusion at about 6:45 AM today.  Patient also has dizziness.  Symptoms has gradually subsided at arrival to the ED. Currently appears back to baseline.    Past Medical History:  Diagnosis Date  . Asthma   . Cardiomegaly   . Hyperlipemia   . Hypertension   . Schizophrenia (HCC)     History reviewed. No pertinent surgical history.  No family history on file.  Social History:  reports that he has been smoking cigarettes. He has never used smokeless tobacco. He reports previous alcohol use. He reports previous drug use.  No Known Allergies  Medications: I have reviewed the patient's current medications. Prior to Admission: (Not in a hospital admission)   ROS: Slight baseline confusion   Physical Examination: Blood pressure (!) 159/72, pulse 66, temperature 98.4 F (36.9 C), resp. rate 14, height 5\' 6"  (1.676 m), weight 77.1 kg, SpO2 100 %.   Neurological Examination   Mental Status: Alert, oriented to place and location  Cranial Nerves: II: Discs flat bilaterally; Visual fields grossly normal, pupils equal, round, reactive to light and accommodation III,IV, VI: ptosis not present, extra-ocular motions intact bilaterally V,VII: smile symmetric, facial light touch sensation normal bilaterally VIII: hearing normal bilaterally Motor: Right : Upper extremity   5/5    Left:     Upper extremity   5/5  Lower extremity   5/5     Lower extremity   5/5 Tone and  bulk:normal tone throughout; no atrophy noted Sensory: Pinprick and light touch intact throughout, bilaterally Deep Tendon Reflexes: 2+ and symmetric throughout Plantars: Right: downgoing   Left: downgoing Cerebellar: normal finger-to-nose      Laboratory Studies:   Basic Metabolic Panel: Recent Labs  Lab 11/25/19 0814  NA 140  K 4.8  CL 104  CO2 24  GLUCOSE 127*  BUN 11  CREATININE 1.42*  CALCIUM 9.6    Liver Function Tests: No results for input(s): AST, ALT, ALKPHOS, BILITOT, PROT, ALBUMIN in the last 168 hours. No results for input(s): LIPASE, AMYLASE in the last 168 hours. No results for input(s): AMMONIA in the last 168 hours.  CBC: Recent Labs  Lab 11/25/19 0814  WBC 10.9*  NEUTROABS 6.4  HGB 15.9  HCT 50.2  MCV 91.9  PLT 249    Cardiac Enzymes: No results for input(s): CKTOTAL, CKMB, CKMBINDEX, TROPONINI in the last 168 hours.  BNP: Invalid input(s): POCBNP  CBG: No results for input(s): GLUCAP in the last 168 hours.  Microbiology: No results found for this or any previous visit.  Coagulation Studies: No results for input(s): LABPROT, INR in the last 72 hours.  Urinalysis: No results for input(s): COLORURINE, LABSPEC, PHURINE, GLUCOSEU, HGBUR, BILIRUBINUR, KETONESUR, PROTEINUR, UROBILINOGEN, NITRITE, LEUKOCYTESUR in the last 168 hours.  Invalid input(s): APPERANCEUR  Lipid Panel:  No results found for: CHOL, TRIG, HDL, CHOLHDL, VLDL, LDLCALC  HgbA1C: No results found for: HGBA1C  Urine Drug Screen:  No results found for: LABOPIA, COCAINSCRNUR, LABBENZ, AMPHETMU, THCU, LABBARB  Alcohol Level:  No results for input(s): ETH in the last 168 hours.  Other results: EKG: normal EKG, normal sinus rhythm, unchanged from previous tracings.  Imaging: CT Head Wo Contrast  Result Date: 11/25/2019 CLINICAL DATA:  Altered mental status, confusion EXAM: CT HEAD WITHOUT CONTRAST TECHNIQUE: Contiguous axial images were obtained from the base of the skull  through the vertex without intravenous contrast. COMPARISON:  None. FINDINGS: Brain: Mild brain atrophy pattern and minimal chronic white matter microvascular ischemic changes about the lateral ventricles bilaterally. No acute intracranial hemorrhage, mass lesion, new infarction, midline shift, herniation, hydrocephalus, or extra-axial fluid collection. No focal mass effect or edema. Cisterns are patent. Mild cerebellar atrophy as well. Vascular: Intracranial atherosclerosis at the skull base. No hyperdense vessel. Skull: Normal. Negative for fracture or focal lesion. Sinuses/Orbits: No acute finding. Other: None. IMPRESSION: Atrophy and chronic white matter microvascular ischemic changes. No acute intracranial abnormality by noncontrast CT. Electronically Signed   By: Jerilynn Mages.  Shick M.D.   On: 11/25/2019 08:36     Assessment/Plan:   64 y.o. male  with medical history significant of hypertension, hyperlipidemia, diabetes mellitus, asthma, TIA, schizophrenia, cardiomegaly, tobacco abuse, CKD stage III, who presents with altered mental status, facial droop, slurred speech and dizziness from group home.  Patient was last known normal at 8 PM last night, and was noted to have left facial droop, slurred speech, confusion at about 6:45 AM today.  Patient also has dizziness.  Symptoms has gradually subsided at arrival to the ED. Currently appears back to baseline.    - CTH no acute abnormalities - On ASA 81mg  at home please continue anti platelet therapy -TIA work up - MRI brain  - possibly d/c later today or tomorrow after above work up   11/25/2019, 11:33 AM

## 2019-11-25 NOTE — ED Notes (Signed)
Pt provided w/ dinner meal tray

## 2019-11-25 NOTE — ED Notes (Signed)
Pt stuck multiple times with no success with IV placement.   IV team consult placed at this time.

## 2019-11-25 NOTE — H&P (Addendum)
History and Physical    Jay Garrett WGN:[id number] DOB: 09/10/1954 DOA: 11/25/2019  Referring MD/NP/PA:   PCP: Martin Majestic, FNP   Patient coming from:  The patient is coming from group home.  At baseline, pt is partially dependent for most of ADL.        Chief Complaint: AMS, facial droop, slurred speech, dizziness  HPI: Jay Garrett is a 64 y.o. male with medical history significant of hypertension, hyperlipidemia, diabetes mellitus, asthma, TIA, schizophrenia, cardiomegaly, tobacco abuse, CKD stage III, who presents with altered mental status, facial droop, slurred speech and dizziness.  Per report, patient was last known normal at 8 PM last night, and was noted to have left facial droop, slurred speech, confusion at about 6:45 AM today.  Patient also has dizziness.  Symptoms has gradually subsided at arrival to the ED. Currently patient does not have unilateral weakness or numbness in extremities.  No facial droop or slurred speech. Patient denies chest pain, cough, shortness breath, fever or chills.  No symptoms of UTI.  Denies nausea, vomiting, diarrhea or abdominal pain.  ED Course: pt was found to have WBC 10.9, troponin VI, pending COVID-19 PCR, slightly worsening renal function, temperature normal, blood pressure 158/97, heart rate 77, RR 18, oxygen saturation 99% on room air.  CT head is negative for acute intracranial abnormalities. Pt is placed on MedSurg for observation.  Dr. Irish Elders for neurology is consulted.  Review of Systems:   General: no fevers, chills, no body weight gain, has fatigue HEENT: no blurry vision, hearing changes or sore throat Respiratory: no dyspnea, coughing, wheezing CV: no chest pain, no palpitations GI: no nausea, vomiting, abdominal pain, diarrhea, constipation GU: no dysuria, burning on urination, increased urinary frequency, hematuria  Ext: no leg edema Neuro: no unilateral weakness, numbness, or tingling, no vision change or hearing  loss. Has AMS, facial droop, slurred speech and dizziness Skin: no rash, no skin tear. MSK: No muscle spasm, no deformity, no limitation of range of movement in spin Heme: No easy bruising.  Travel history: No recent long distant travel.  Allergy: No Known Allergies  Past Medical History:  Diagnosis Date  . Asthma   . Cardiomegaly   . Hyperlipemia   . Hypertension   . Schizophrenia ([place])     History reviewed. No pertinent surgical history.  Social History:  reports that he has been smoking cigarettes. He has never used smokeless tobacco. He reports previous alcohol use. He reports previous drug use.  Family History: No family history on file.   Prior to Admission medications   Medication Sig Start Date End Date Taking? Authorizing Provider  metFORMIN (GLUCOPHAGE) 500 MG tablet Take 1 tablet (500 mg total) by mouth daily with breakfast. 02/02/18 02/02/19  Orbie Pyo, MD    Physical Exam: Vitals:   11/25/19 0915 11/25/19 0930 11/25/19 0945 11/25/19 1000  BP:  (!) 152/72  (!) 159/72  Pulse: 63 62 63 66  Resp: 15 15 15 14   Temp:      SpO2: 100% 100% 99% 100%  Weight:      Height:       General: Not in acute distress HEENT:       Eyes: PERRL, EOMI, no scleral icterus.       ENT: No discharge from the ears and nose, no pharynx injection, no tonsillar enlargement.        Neck: No JVD, no bruit, no mass felt. Heme: No neck lymph node enlargement. Cardiac: S1/S2, RRR, No  murmurs, No gallops or rubs. Respiratory: No rales, wheezing, rhonchi or rubs. GI: Soft, nondistended, nontender, no rebound pain, no organomegaly, BS present. GU: No hematuria Ext: No pitting leg edema bilaterally. 2+DP/PT pulse bilaterally. Musculoskeletal: No joint deformities, No joint redness or warmth, no limitation of ROM in spin. Skin: No rashes.  Neuro: Alert, oriented X3, cranial nerves II-XII grossly intact, moves all extremities normally. Muscle strength 5/5 in all extremities,  sensation to light touch intact. Brachial reflex 2+ bilaterally.  Psych: Patient is not psychotic, no suicidal or hemocidal ideation.  Labs on Admission: I have personally reviewed following labs and imaging studies  CBC: Recent Labs  Lab 11/25/19 0814  WBC 10.9*  NEUTROABS 6.4  HGB 15.9  HCT 50.2  MCV 91.9  PLT 249   Basic Metabolic Panel: Recent Labs  Lab 11/25/19 0814  NA 140  K 4.8  CL 104  CO2 24  GLUCOSE 127*  BUN 11  CREATININE 1.42*  CALCIUM 9.6   GFR: Estimated Creatinine Clearance: 51.4 mL/min (A) (by C-G formula based on SCr of 1.42 mg/dL (H)). Liver Function Tests: No results for input(s): AST, ALT, ALKPHOS, BILITOT, PROT, ALBUMIN in the last 168 hours. No results for input(s): LIPASE, AMYLASE in the last 168 hours. No results for input(s): AMMONIA in the last 168 hours. Coagulation Profile: No results for input(s): INR, PROTIME in the last 168 hours. Cardiac Enzymes: No results for input(s): CKTOTAL, CKMB, CKMBINDEX, TROPONINI in the last 168 hours. BNP (last 3 results) No results for input(s): PROBNP in the last 8760 hours. HbA1C: No results for input(s): HGBA1C in the last 72 hours. CBG: No results for input(s): GLUCAP in the last 168 hours. Lipid Profile: No results for input(s): CHOL, HDL, LDLCALC, TRIG, CHOLHDL, LDLDIRECT in the last 72 hours. Thyroid Function Tests: No results for input(s): TSH, T4TOTAL, FREET4, T3FREE, THYROIDAB in the last 72 hours. Anemia Panel: No results for input(s): VITAMINB12, FOLATE, FERRITIN, TIBC, IRON, RETICCTPCT in the last 72 hours. Urine analysis:    Component Value Date/Time   COLORURINE YELLOW (A) 02/02/2018 1022   APPEARANCEUR CLEAR (A) 02/02/2018 1022   LABSPEC 1.033 (H) 02/02/2018 1022   PHURINE 6.0 02/02/2018 1022   GLUCOSEU >=500 (A) 02/02/2018 1022   HGBUR NEGATIVE 02/02/2018 1022   BILIRUBINUR NEGATIVE 02/02/2018 1022   KETONESUR 20 (A) 02/02/2018 1022   PROTEINUR NEGATIVE 02/02/2018 1022    NITRITE NEGATIVE 02/02/2018 1022   LEUKOCYTESUR NEGATIVE 02/02/2018 1022   Sepsis Labs: @LABRCNTIP (procalcitonin:4,lacticidven:4) )No results found for this or any previous visit (from the past 240 hour(s)).   Radiological Exams on Admission: CT Head Wo Contrast  Result Date: 11/25/2019 CLINICAL DATA:  Altered mental status, confusion EXAM: CT HEAD WITHOUT CONTRAST TECHNIQUE: Contiguous axial images were obtained from the base of the skull through the vertex without intravenous contrast. COMPARISON:  None. FINDINGS: Brain: Mild brain atrophy pattern and minimal chronic white matter microvascular ischemic changes about the lateral ventricles bilaterally. No acute intracranial hemorrhage, mass lesion, new infarction, midline shift, herniation, hydrocephalus, or extra-axial fluid collection. No focal mass effect or edema. Cisterns are patent. Mild cerebellar atrophy as well. Vascular: Intracranial atherosclerosis at the skull base. No hyperdense vessel. Skull: Normal. Negative for fracture or focal lesion. Sinuses/Orbits: No acute finding. Other: None. IMPRESSION: Atrophy and chronic white matter microvascular ischemic changes. No acute intracranial abnormality by noncontrast CT. Electronically Signed   By: 01/25/2020.  Shick M.D.   On: 11/25/2019 08:36     EKG: Independently reviewed.  Sinus rhythm,  and stable baseline of strips, QTC 414, borderline LAD, nonspecific T wave change.   Assessment/Plan Principal Problem:   Acute metabolic encephalopathy Active Problems:   Asthma   Hyperlipemia   Hypertension   Schizophrenia (HCC)   Facial droop   Slurred speech   Type II diabetes mellitus with renal manifestations (HCC)   Acute renal failure superimposed on stage 3a chronic kidney disease (HCC)   TIA (transient ischemic attack)   Acute metabolic encephalopathy, facial droop and slurred speech: Patient symptoms are concerning for TIA.  CT head is negative for acute intracranial abnormalities.   Neurology, Dr. Loretha Brasil is consulted.  -Placed on MedSurg bed for observation - will follow up Neurology's Recs.  - Obtain MRI and MRA of brain - 2d echo - Check carotid dopplers  - continue ASA and lipitor - fasting lipid panel and HbA1c  - swallowing screen. If fails, will get SLP - PT/OT consult  Hx of TIA: -ASA and lipitor  Asthma: stable -Bronchodilators -Singulair  Hyperlipemia -Lipitor  HTN:  -Continue home medications: Propranolol -Hold Cozaar due to worsening renal function -hydralazine prn  Schizophrenia (HCC): pt is calm now -Continue Wellbutrin, Depakote, lurasidone  Type II diabetes mellitus with renal manifestations (HCC): No A1c on record.  Patient is taking Jardiance and Metformin at home. -SSI -check A1c  Acute renal failure superimposed on stage 3a chronic kidney disease (HCC): Baseline creatinine 1.27 on 02/02/2018.  His creatinine is 1.42, BUN 11. -hold Cozaar -IV fluid: Normal saline at 75 cc/h    DVT ppx: SQ Lovenox Code Status: Full code Family Communication: Yes, I called group home manager. I also tried to call his brother without success. disposition Plan:  Anticipate discharge back to previous home environment Consults called: Dr. Loretha Brasil of neurology Admission status: Med-surg bed for obs     Status is: Observation  The patient remains OBS appropriate and will d/c before 2 midnights.  Dispo: The patient is from: Group home              Anticipated d/c is to: Group home              Anticipated d/c date is: 1 day              Patient currently is not medically stable to d/c.           Date of Service 11/25/2019    Lorretta Harp [facility]   If 7PM-7AM, please contact night-coverage [email or web address] 11/25/2019, 11:01 AM

## 2019-11-25 NOTE — ED Triage Notes (Signed)
Pt comes via [facility] from [facility]. Staff reports pt became confused, slurred speech and facial droop.  EMS arrived on scene and states mini neuro negative. VSS, CBG-196. LKW was 8pm last night.  Pt arrives Alert and oriented to place and person. Pt able to follow commands. Pt states he felt dizzy earlier but not now.  EMS also reports hx of TIA.  MD at bedside, neuro exam performed.

## 2019-11-25 NOTE — Progress Notes (Signed)
*  PRELIMINARY RESULTS* Echocardiogram 2D Echocardiogram has been performed.  Cristela Blue 11/25/2019, 2:17 PM

## 2019-11-26 ENCOUNTER — Observation Stay: Payer: PPO

## 2019-11-26 DIAGNOSIS — G459 Transient cerebral ischemic attack, unspecified: Secondary | ICD-10-CM | POA: Diagnosis not present

## 2019-11-26 DIAGNOSIS — G9341 Metabolic encephalopathy: Secondary | ICD-10-CM | POA: Diagnosis not present

## 2019-11-26 DIAGNOSIS — N179 Acute kidney failure, unspecified: Secondary | ICD-10-CM | POA: Diagnosis not present

## 2019-11-26 DIAGNOSIS — I6523 Occlusion and stenosis of bilateral carotid arteries: Secondary | ICD-10-CM | POA: Diagnosis not present

## 2019-11-26 LAB — BASIC METABOLIC PANEL
Anion gap: 9 (ref 5–15)
BUN: 11 mg/dL (ref 8–23)
CO2: 23 mmol/L (ref 22–32)
Calcium: 9.8 mg/dL (ref 8.9–10.3)
Chloride: 107 mmol/L (ref 98–111)
Creatinine, Ser: 0.99 mg/dL (ref 0.61–1.24)
GFR calc Af Amer: >60 mL/min (ref >60)
GFR calc non Af Amer: >60 mL/min (ref >60)
Glucose, Bld: 133 mg/dL — ABNORMAL HIGH (ref 70–99)
Potassium: 3.9 mmol/L (ref 3.5–5.1)
Sodium: 139 mmol/L (ref 135–145)

## 2019-11-26 LAB — GLUCOSE, CAPILLARY
Glucose-Capillary: 88 mg/dL (ref 70–99)
Glucose-Capillary: 126 mg/dL — ABNORMAL HIGH (ref 70–99)
Glucose-Capillary: 105 mg/dL — ABNORMAL HIGH (ref 70–99)
Glucose-Capillary: 117 mg/dL — ABNORMAL HIGH (ref 70–99)

## 2019-11-26 LAB — CBC
HCT: 44.2 % (ref 39.0–52.0)
Hemoglobin: 14.2 g/dL (ref 13.0–17.0)
MCH: 28.7 pg (ref 26.0–34.0)
MCHC: 32.1 g/dL (ref 30.0–36.0)
MCV: 89.3 fL (ref 80.0–100.0)
Platelets: 228 10*3/uL (ref 150–400)
RBC: 4.95 MIL/uL (ref 4.22–5.81)
RDW: 13.5 % (ref 11.5–15.5)
WBC: 9.3 10*3/uL (ref 4.0–10.5)
nRBC: 0 % (ref 0.0–0.2)

## 2019-11-26 LAB — HEMOGLOBIN A1C
Hgb A1c MFr Bld: 5.2 % (ref 4.8–5.6)
Mean Plasma Glucose: 102.54 mg/dL

## 2019-11-26 LAB — LIPID PANEL
Cholesterol: 129 mg/dL (ref 0–200)
HDL: 22 mg/dL — ABNORMAL LOW (ref >40)
LDL Cholesterol: UNDETERMINED mg/dL (ref 0–99)
Total CHOL/HDL Ratio: 5.9 RATIO
Triglycerides: 486 mg/dL — ABNORMAL HIGH (ref <150)
VLDL: UNDETERMINED mg/dL (ref 0–40)

## 2019-11-26 LAB — LDL CHOLESTEROL, DIRECT: Direct LDL: 68.9 mg/dL (ref 0–99)

## 2019-11-26 LAB — HIV ANTIBODY (ROUTINE TESTING W REFLEX): HIV Screen 4th Generation wRfx: NONREACTIVE

## 2019-11-26 MED ORDER — AMLODIPINE BESYLATE 10 MG PO TABS
10.0000 mg | ORAL_TABLET | Freq: Every day | ORAL | Status: DC
  Administered 2019-11-26 – 2019-11-27 (×2): 10 mg via ORAL
  Filled 2019-11-26 (×2): qty 1

## 2019-11-26 MED ORDER — HYDRALAZINE HCL 20 MG/ML IJ SOLN: 10.0000 mg | INTRAMUSCULAR | Status: DC | PRN

## 2019-11-26 MED ORDER — METOPROLOL TARTRATE 5 MG/5ML IV SOLN
5.0000 mg | Freq: Once | INTRAVENOUS | Status: AC
  Administered 2019-11-26: 5 mg via INTRAVENOUS
  Filled 2019-11-26: qty 5

## 2019-11-26 NOTE — Progress Notes (Signed)
OT Cancellation Note  Patient Details Name: Jay Garrett MRN: [id number] DOB: [birth year]   Cancelled Treatment:    Reason Eval/Treat Not Completed: OT screened, no needs identified, will sign off. Per conversation c PT, pt is MOD I for mobility and has no skilled acute OT needs identified. Will sign off at this time, please re-consult if needs arise.   Kathie Dike, M.S. OTR/L  11/26/19, 2:14 PM

## 2019-11-26 NOTE — Evaluation (Signed)
Physical Therapy Evaluation Patient Details Name: Jay Garrett MRN: [id number] DOB: Jul 21, 1956 Today's Date: 11/26/2019   History of Present Illness  presented to ER secondary to AMS, facial droop and slurred speech; admitted for TIA/CVA work up.  Imaging negative.  Admitted for management of acute metabolic encephalopathy  Clinical Impression  Patient seated in recliner upon arrival to room; eager for mobility efforts with therapist.  BP improved <195/85; cleared for participation with session per primary RN.  Patient alert and oriented to basic information; follows commands, pleasant and cooperative throughout session.  Speech fluent and articulate; no slurring or dysarthria noted.  Bilat UE/LE strength and ROM grossly symmetrical and WFL; no focal weakness appreciated.  Able to complete sit/stand, basic transfers and gait (400') without assist device, mod indep.  Demonstrates reciprocal stepping pattern with good step height/lenght, good cadence and overall gait mechanics.  No buckling, LOB or safety concerns.  Completes dynamic gait components without difficulty; reports feeling at baseline for mobility tasks. Will complete initial PT order at this time, as patient without clinical indications for acute PT intervention.  Safe for return to group home environment when cleared medically.  Please re-consult should needs change.    Follow Up Recommendations No PT follow up    Equipment Recommendations       Recommendations for Other Services       Precautions / Restrictions Precautions Precautions: Fall Restrictions Weight Bearing Restrictions: No      Mobility  Bed Mobility               General bed mobility comments: seated in recliner beginning/end of treatment session  Transfers Overall transfer level: Needs assistance Equipment used: None Transfers: Sit to/from Stand Sit to Stand: Supervision;Modified independent (Device/Increase time)         General transfer  comment: good LE strength/power with movement transition  Ambulation/Gait Ambulation/Gait assistance: Supervision;Modified independent (Device/Increase time) Gait Distance (Feet): 400 Feet Assistive device: None   Gait velocity: 10' walk time, 5-6 seconds   General Gait Details: reciprocal stepping pattern with good step height/lenght, good cadence and overall gait mechanics.  No buckling, LOB or safety concerns.  Completes dynamic gait components without difficulty; reports feeling at baseline for mobility tasks.  Stairs            Wheelchair Mobility    Modified Rankin (Stroke Patients Only)       Balance Overall balance assessment: Modified Independent                                           Pertinent Vitals/Pain Pain Assessment: No/denies pain    Home Living Family/patient expects to be discharged to:: Group home                 Additional Comments: 8-year resident of group home facility, single story with 'a few steps' to enter.    Prior Function Level of Independence: Independent         Comments: Mod indep with ADLs, household mobilization; intermittent use of 'walking stick' at times.  Denies fall history.  Patient responsible for taking garbage bins to curb for weekly pick up at group home.     Hand Dominance        Extremity/Trunk Assessment   Upper Extremity Assessment Upper Extremity Assessment: Overall WFL for tasks assessed(grossly at least 4+/5 throughout; no focal weakness, sensory or coordination deficits)  Lower Extremity Assessment Lower Extremity Assessment: Overall WFL for tasks assessed(grossly at least 4+/5 throughout; no focal weakness, sensory or coordination deficits)       Communication   Communication: No difficulties(clear, articulate and intelligible)  Cognition Arousal/Alertness: Awake/alert Behavior During Therapy: WFL for tasks assessed/performed Overall Cognitive Status: Within Functional  Limits for tasks assessed                                        General Comments      Exercises Other Exercises Other Exercises: Toilet transfer, ambulatory without assist device, mod indep; sit/stand from standard BSC, mod indep; standing balance at sink for hand hygiene, mod indep.  Negotiates obstacles, narrowed spaces without difficulty   Assessment/Plan    PT Assessment Patent does not need any further PT services  PT Problem List Decreased mobility       PT Treatment Interventions      PT Goals (Current goals can be found in the Care Plan section)  Acute Rehab PT Goals Patient Stated Goal: to get back home! PT Goal Formulation: With patient Time For Goal Achievement: 12/10/19 Potential to Achieve Goals: Good    Frequency     Barriers to discharge        Co-evaluation               AM-PAC PT "6 Clicks" Mobility  Outcome Measure Help needed turning from your back to your side while in a flat bed without using bedrails?: None Help needed moving from lying on your back to sitting on the side of a flat bed without using bedrails?: None Help needed moving to and from a bed to a chair (including a wheelchair)?: None Help needed standing up from a chair using your arms (e.g., wheelchair or bedside chair)?: None Help needed to walk in hospital room?: None Help needed climbing 3-5 steps with a railing? : None 6 Click Score: 24    End of Session Equipment Utilized During Treatment: Gait belt Activity Tolerance: Patient tolerated treatment well Patient left: in chair;with call bell/phone within reach;with chair alarm set Nurse Communication: Mobility status PT Visit Diagnosis: Muscle weakness (generalized) (M62.81)    Time: 4765-4650 PT Time Calculation (min) (ACUTE ONLY): 12 min   Charges:   PT Evaluation $PT Eval Moderate Complexity: 1 Mod          Desirre Eickhoff H. Owens Shark, PT, DPT, NCS 11/26/19, 1:52 PM [phone]

## 2019-11-26 NOTE — Progress Notes (Signed)
The patient is off the unit for an Mri. Unable to complete neuro assessment at this time.

## 2019-11-26 NOTE — Progress Notes (Signed)
PT Cancellation Note  Patient Details Name: Jay Garrett MRN: [id number] DOB: 03-22-1956   Cancelled Treatment:    Reason Eval/Treat Not Completed: (Consult received and chart reviewed. Patient noted with elevated BP (227/81); contraindicated for exertional activity at this time.  RN informed/aware and to administer PRN medications for improved BP control.  Will re-attempt at later time/date as medically appropriate and available.)  Of note, patient reports being 8-year resident of group home facility; single-level with 'a few' steps to get in.  Ambulatory without assist device at baseline.   [name] H. Manson Passey, PT, DPT, NCS 11/26/19, 9:14 AM [phone]

## 2019-11-26 NOTE — Progress Notes (Signed)
OT Cancellation Note  Patient Details Name: Jay Garrett MRN: [id number] DOB: 09/14/1955   Cancelled Treatment:    Reason Eval/Treat Not Completed: Medical issues which prohibited therapy. OT order received and chart reviewed. RN reports pt has elevated BP and requests hold OT evaluation until medication administered. Will follow acutely and initiate services as available and appropriate.   Kathie Dike, M.S. OTR/L  11/26/19, 9:29 AM

## 2019-11-26 NOTE — Progress Notes (Signed)
PROGRESS NOTE    Jay Garrett  BDZ:[id number] DOB: Dec 04, 1955 DOA: 11/25/2019 PCP: Grayland Jack, FNP       Assessment & Plan:   Principal Problem:   Acute metabolic encephalopathy Active Problems:   Asthma   Hyperlipemia   Hypertension   Schizophrenia (HCC)   Facial droop   Slurred speech   Type II diabetes mellitus with renal manifestations (HCC)   Acute renal failure superimposed on stage 3a chronic kidney disease (HCC)   TIA (transient ischemic attack)   Acute metabolic encephalopathy: etiology unclear, possible TIA as pt has facial droop and slurred speech. Oriented to person, place only- unknown baseline. CT head is negative for acute intracranial abnormalities. MRI brain & MRA brain are both normal. Echo shows EF 60-65%, grade I diastolic dysfunction. Carotid US ordered. Neuro following and recs apprec. PT/OT consulted   Hx of TIA: continue on aspirin and lipitor  Asthma: stable. Continue on bronchodilators  singulair  Hyperlipemia: continue on statin   HTN: continue on propranolol. Hold Cozaar due to worsening renal function. Hydralazine prn  Schizophrenia: unknown type or severity. Continue Wellbutrin, Depakote, lurasidone  DM2 with renal manifestations: No A1c on record.  Hold home dose of jardiance and metformin. Continue on SSI w/ accuchecks. HbA1c pending   AKI on CKDIIIa: baseline creatinine 1.27 on 02/02/2018. Continue to hold Cozaar. Continue on IVFs   DVT prophylaxis: lovenox Code Status: full  Family Communication: called pt's brother, Jomarie Longs, but no answer  Disposition Plan: likely d/c back to group home. Unlikely any barriers. Awaiting PT/OT to see the pt    Consultants:   Neuro    Procedures:    Antimicrobials:    Subjective: Pt c/o fatigue   Objective: Vitals:   11/26/19 0600 11/26/19 0744 11/26/19 0900 11/26/19 0940  BP: (!) 152/72 (!) 177/74 (!) 227/81 (!) 180/83  Pulse: 72 69    Resp: 14 18    Temp: 98.2 F  (36.8 C) 97.8 F (36.6 C)    TempSrc: Oral Oral    SpO2: 96% 95%    Weight:      Height:        Intake/Output Summary (Last 24 hours) at 11/26/2019 1023 Last data filed at 11/26/2019 1011 Gross per 24 hour  Intake 1143.57 ml  Output 1000 ml  Net 143.57 ml   Filed Weights   11/25/19 0805  Weight: 77.1 kg    Examination:  General exam: Appears calm and comfortable  Respiratory system: Clear to auscultation. No wheezes, rales  Cardiovascular system: S1 & S2+. No rubs, gallops or clicks.  Gastrointestinal system: Abdomen is nondistended, soft and nontender.  Normal bowel sounds heard. Central nervous system: Alert and oriented to person, place only. Moves all 4 extremities  Psychiatry: Judgement and insight appear abnormal. Mood & affect appropriate.     Data Reviewed: I have personally reviewed following labs and imaging studies  CBC: Recent Labs  Lab 11/25/19 0814  WBC 10.9*  NEUTROABS 6.4  HGB 15.9  HCT 50.2  MCV 91.9  PLT 249   Basic Metabolic Panel: Recent Labs  Lab 11/25/19 0814  NA 140  K 4.8  CL 104  CO2 24  GLUCOSE 127*  BUN 11  CREATININE 1.42*  CALCIUM 9.6   GFR: Estimated Creatinine Clearance: 52.7 mL/min (A) (by C-G formula based on SCr of 1.42 mg/dL (H)). Liver Function Tests: No results for input(s): AST, ALT, ALKPHOS, BILITOT, PROT, ALBUMIN in the last 168 hours. No results for input(s): LIPASE, AMYLASE  in the last 168 hours. No results for input(s): AMMONIA in the last 168 hours. Coagulation Profile: No results for input(s): INR, PROTIME in the last 168 hours. Cardiac Enzymes: No results for input(s): CKTOTAL, CKMB, CKMBINDEX, TROPONINI in the last 168 hours. BNP (last 3 results) No results for input(s): PROBNP in the last 8760 hours. HbA1C: No results for input(s): HGBA1C in the last 72 hours. CBG: Recent Labs  Lab 11/25/19 1519 11/25/19 1911 11/25/19 2210 11/26/19 0743  GLUCAP 91 85 87 88   Lipid Profile: Recent Labs     11/26/19 0455  CHOL 129  HDL 22*  LDLCALC UNABLE TO CALCULATE IF TRIGLYCERIDE OVER 400 mg/dL  TRIG 295*  CHOLHDL 5.9   Thyroid Function Tests: No results for input(s): TSH, T4TOTAL, FREET4, T3FREE, THYROIDAB in the last 72 hours. Anemia Panel: No results for input(s): VITAMINB12, FOLATE, FERRITIN, TIBC, IRON, RETICCTPCT in the last 72 hours. Sepsis Labs: No results for input(s): PROCALCITON, LATICACIDVEN in the last 168 hours.  Recent Results (from the past 240 hour(s))  SARS CORONAVIRUS 2 (TAT 6-24 HRS) Nasopharyngeal Nasopharyngeal Swab     Status: None   Collection Time: 11/25/19  9:06 AM   Specimen: Nasopharyngeal Swab  Result Value Ref Range Status   SARS Coronavirus 2 NEGATIVE NEGATIVE Final    Comment: (NOTE) SARS-CoV-2 target nucleic acids are NOT DETECTED. The SARS-CoV-2 RNA is generally detectable in upper and lower respiratory specimens during the acute phase of infection. Negative results do not preclude SARS-CoV-2 infection, do not rule out co-infections with other pathogens, and should not be used as the sole basis for treatment or other patient management decisions. Negative results must be combined with clinical observations, patient history, and epidemiological information. The expected result is Negative. Fact Sheet for Patients: HairSlick.no Fact Sheet for Healthcare Providers: quierodirigir.com This test is not yet approved or cleared by the [place] FDA and  has been authorized for detection and/or diagnosis of SARS-CoV-2 by FDA under an Emergency Use Authorization (EUA). This EUA will remain  in effect (meaning this test can be used) for the duration of the COVID-19 declaration under Section 56 4(b)(1) of the Act, 21 U.S.C. section 360bbb-3(b)(1), unless the authorization is terminated or revoked sooner. Performed at [facility], [address] [place], [place] [address]           Radiology Studies: CT Head Wo Contrast  Result Date: 11/25/2019 CLINICAL DATA:  Altered mental status, confusion EXAM: CT HEAD WITHOUT CONTRAST TECHNIQUE: Contiguous axial images were obtained from the base of the skull through the vertex without intravenous contrast. COMPARISON:  None. FINDINGS: Brain: Mild brain atrophy pattern and minimal chronic white matter microvascular ischemic changes about the lateral ventricles bilaterally. No acute intracranial hemorrhage, mass lesion, new infarction, midline shift, herniation, hydrocephalus, or extra-axial fluid collection. No focal mass effect or edema. Cisterns are patent. Mild cerebellar atrophy as well. Vascular: Intracranial atherosclerosis at the skull base. No hyperdense vessel. Skull: Normal. Negative for fracture or focal lesion. Sinuses/Orbits: No acute finding. Other: None. IMPRESSION: Atrophy and chronic white matter microvascular ischemic changes. No acute intracranial abnormality by noncontrast CT. Electronically Signed   By: Judie Petit.  Shick M.D.   On: 11/25/2019 08:36   MR ANGIO HEAD WO CONTRAST  Result Date: 11/26/2019 CLINICAL DATA:  Initial evaluation for transient ischemic attack. EXAM: MRI HEAD WITHOUT CONTRAST MRA HEAD WITHOUT CONTRAST TECHNIQUE: Multiplanar, multiecho pulse sequences of the brain and surrounding structures were obtained without intravenous contrast. Angiographic images of the  head were obtained using MRA technique without contrast. COMPARISON:  Prior head CT from 11/25/2019. FINDINGS: MRI HEAD FINDINGS Brain: Cerebral volume within normal limits for patient age. Few scattered foci of subcentimeter T2/FLAIR hyperintensity noted involving the periventricular deep white matter both cerebral hemispheres, nonspecific, but felt to be within normal limits for age. No abnormal foci of restricted diffusion to suggest acute or subacute ischemia. Gray-white matter differentiation well maintained. No encephalomalacia to suggest  chronic infarction. No foci of susceptibility artifact to suggest acute or chronic intracranial hemorrhage. No mass lesion, midline shift or mass effect. No hydrocephalus. No extra-axial fluid collection. Major dural sinuses are grossly patent. Pituitary gland and suprasellar region are normal. Midline structures intact and normal. Vascular: Major intracranial vascular flow voids well maintained and normal in appearance. Skull and upper cervical spine: Craniocervical junction normal. Visualized upper cervical spine within normal limits. Bone marrow signal intensity normal. No scalp soft tissue abnormality. Sinuses/Orbits: Globes and orbital soft tissues within normal limits. Mild scattered mucosal thickening noted throughout the ethmoidal air cells and maxillary sinuses. Paranasal sinuses are otherwise clear. No mastoid effusion. Inner ear structures normal. Other: None. MRA HEAD FINDINGS ANTERIOR CIRCULATION: Visualized distal cervical segments of the internal carotid arteries are widely patent with symmetric antegrade flow. Petrous, cavernous, and supraclinoid ICAs widely patent without stenosis or other abnormality. A1 segments widely patent. Normal anterior communicating artery complex. Anterior cerebral arteries widely patent to their distal aspects without stenosis. No M1 stenosis or occlusion. Normal MCA bifurcations. Distal MCA branches well perfused and symmetric. POSTERIOR CIRCULATION: Both vertebral arteries widely patent to the vertebrobasilar junction. Neither PICA origin visualized. Basilar widely patent to its distal aspect without stenosis. Superior cerebral arteries patent bilaterally. Both PCAs primarily supplied via the basilar and are well perfused to their distal aspects. No intracranial aneurysm. IMPRESSION: 1. Normal brain MRI for age. No acute intracranial abnormality identified. 2. Normal intracranial MRA. Electronically Signed   By: Rise MuBenjamin  McClintock M.D.   On: 11/26/2019 02:35   MR  BRAIN WO CONTRAST  Result Date: 11/26/2019 CLINICAL DATA:  Initial evaluation for transient ischemic attack. EXAM: MRI HEAD WITHOUT CONTRAST MRA HEAD WITHOUT CONTRAST TECHNIQUE: Multiplanar, multiecho pulse sequences of the brain and surrounding structures were obtained without intravenous contrast. Angiographic images of the head were obtained using MRA technique without contrast. COMPARISON:  Prior head CT from 11/25/2019. FINDINGS: MRI HEAD FINDINGS Brain: Cerebral volume within normal limits for patient age. Few scattered foci of subcentimeter T2/FLAIR hyperintensity noted involving the periventricular deep white matter both cerebral hemispheres, nonspecific, but felt to be within normal limits for age. No abnormal foci of restricted diffusion to suggest acute or subacute ischemia. Gray-white matter differentiation well maintained. No encephalomalacia to suggest chronic infarction. No foci of susceptibility artifact to suggest acute or chronic intracranial hemorrhage. No mass lesion, midline shift or mass effect. No hydrocephalus. No extra-axial fluid collection. Major dural sinuses are grossly patent. Pituitary gland and suprasellar region are normal. Midline structures intact and normal. Vascular: Major intracranial vascular flow voids well maintained and normal in appearance. Skull and upper cervical spine: Craniocervical junction normal. Visualized upper cervical spine within normal limits. Bone marrow signal intensity normal. No scalp soft tissue abnormality. Sinuses/Orbits: Globes and orbital soft tissues within normal limits. Mild scattered mucosal thickening noted throughout the ethmoidal air cells and maxillary sinuses. Paranasal sinuses are otherwise clear. No mastoid effusion. Inner ear structures normal. Other: None. MRA HEAD FINDINGS ANTERIOR CIRCULATION: Visualized distal cervical segments of the internal carotid arteries are  widely patent with symmetric antegrade flow. Petrous, cavernous, and  supraclinoid ICAs widely patent without stenosis or other abnormality. A1 segments widely patent. Normal anterior communicating artery complex. Anterior cerebral arteries widely patent to their distal aspects without stenosis. No M1 stenosis or occlusion. Normal MCA bifurcations. Distal MCA branches well perfused and symmetric. POSTERIOR CIRCULATION: Both vertebral arteries widely patent to the vertebrobasilar junction. Neither PICA origin visualized. Basilar widely patent to its distal aspect without stenosis. Superior cerebral arteries patent bilaterally. Both PCAs primarily supplied via the basilar and are well perfused to their distal aspects. No intracranial aneurysm. IMPRESSION: 1. Normal brain MRI for age. No acute intracranial abnormality identified. 2. Normal intracranial MRA. Electronically Signed   By: Rise Mu M.D.   On: 11/26/2019 02:35   US Carotid Bilateral (at [facility] and [facility] only)  Result Date: 11/26/2019 CLINICAL DATA:  TIA, confusion, slurred speech, left facial droop. Hypertension, hyperlipidemia, diabetes, previous tobacco abuse EXAM: BILATERAL CAROTID DUPLEX ULTRASOUND TECHNIQUE: Wallace Cullens scale imaging, color Doppler and duplex ultrasound were performed of bilateral carotid and vertebral arteries in the neck. COMPARISON:  None. FINDINGS: Criteria: Quantification of carotid stenosis is based on velocity parameters that correlate the residual internal carotid diameter with NASCET-based stenosis levels, using the diameter of the distal internal carotid lumen as the denominator for stenosis measurement. The following velocity measurements were obtained: RIGHT ICA: 45/15 cm/sec CCA: 67/12 cm/sec SYSTOLIC ICA/CCA RATIO:  0.7 ECA: 77 cm/sec LEFT ICA: 58/22 cm/sec CCA: 64/12 cm/sec SYSTOLIC ICA/CCA RATIO:  0.9 ECA: 95 cm/sec RIGHT CAROTID ARTERY: Intimal thickening in the common carotid artery. Partially calcified plaque in the proximal ICA with only mild stenosis. Normal waveforms and color  Doppler signal. RIGHT VERTEBRAL ARTERY:  Normal flow direction and waveform. LEFT CAROTID ARTERY: Eccentric partially calcified plaque in the bulb, proximal ECA, and proximal ICA. No high-grade stenosis. Normal waveforms and color Doppler signal. LEFT VERTEBRAL ARTERY:  Normal flow direction and waveform. IMPRESSION: 1. Bilateral carotid bifurcation plaque resulting in less than 50% diameter ICA stenosis. 2. Antegrade bilateral vertebral arterial flow. Electronically Signed   By: Corlis Leak M.D.   On: 11/26/2019 08:30   ECHOCARDIOGRAM COMPLETE  Result Date: 11/25/2019    ECHOCARDIOGRAM REPORT   Patient Name:   Jay Garrett Date of Exam: 11/25/2019 Medical Rec #:  [id number]     Height:       66.0 in Accession #:    [id number]    Weight:       170.0 lb Date of Birth:  [birth year]     BSA:          1.866 m Patient Age:    63 years      BP:           145/72 mmHg Patient Gender: M             HR:           66 bpm. Exam Location:  [facility] Procedure: 2D Echo, Cardiac Doppler and Color Doppler Indications:     TIA 435.9  History:         Patient has no prior history of Echocardiogram examinations.                  Risk Factors:Hypertension. Cardiomegaly , schizophrenia.  Sonographer:     Cristela Blue RDCS (AE) Referring Phys:  [id number] Brien Few NIU Diagnosing Phys: Julien Nordmann MD IMPRESSIONS  1. Left ventricular ejection fraction, by estimation, is 60 to 65%. The left ventricle has normal function.  The left ventricle has no regional wall motion abnormalities. There is mild left ventricular hypertrophy. Left ventricular diastolic parameters are consistent with Grade I diastolic dysfunction (impaired relaxation).  2. Right ventricular systolic function is normal. The right ventricular size is normal. There is normal pulmonary artery systolic pressure.  3. The aortic valve was not well visualized. Unable to determine number of leaflets. Aortic valve regurgitation is mild. Mild aortic valve sclerosis is present, with no evidence of  aortic valve stenosis. FINDINGS  Left Ventricle: Left ventricular ejection fraction, by estimation, is 60 to 65%. The left ventricle has normal function. The left ventricle has no regional wall motion abnormalities. The left ventricular internal cavity size was normal in size. There is  mild left ventricular hypertrophy. Left ventricular diastolic parameters are consistent with Grade I diastolic dysfunction (impaired relaxation). Right Ventricle: The right ventricular size is normal. No increase in right ventricular wall thickness. Right ventricular systolic function is normal. There is normal pulmonary artery systolic pressure. The tricuspid regurgitant velocity is 2.20 m/s, and  with an assumed right atrial pressure of 5 mmHg, the estimated right ventricular systolic pressure is 76.7 mmHg. Left Atrium: Left atrial size was normal in size. Right Atrium: Right atrial size was normal in size. Pericardium: There is no evidence of pericardial effusion. Mitral Valve: The mitral valve is normal in structure. Normal mobility of the mitral valve leaflets. No evidence of mitral valve regurgitation. No evidence of mitral valve stenosis. Tricuspid Valve: The tricuspid valve is normal in structure. Tricuspid valve regurgitation is not demonstrated. No evidence of tricuspid stenosis. Aortic Valve: The aortic valve was not well visualized. Aortic valve regurgitation is mild. Mild aortic valve sclerosis is present, with no evidence of aortic valve stenosis. Aortic valve mean gradient measures 3.5 mmHg. Aortic valve peak gradient measures 6.4 mmHg. Aortic valve area, by VTI measures 2.73 cm. Pulmonic Valve: The pulmonic valve was normal in structure. Pulmonic valve regurgitation is not visualized. No evidence of pulmonic stenosis. Aorta: The aortic root is normal in size and structure. Venous: The inferior vena cava is normal in size with greater than 50% respiratory variability, suggesting right atrial pressure of 3 mmHg.  IAS/Shunts: No atrial level shunt detected by color flow Doppler.  LEFT VENTRICLE PLAX 2D LVIDd:         3.54 cm  Diastology LVIDs:         2.33 cm  LV e' lateral:   7.07 cm/s LV PW:         1.18 cm  LV E/e' lateral: 7.8 LV IVS:        1.57 cm  LV e' medial:    4.57 cm/s LVOT diam:     2.00 cm  LV E/e' medial:  12.1 LV SV:         67 LV SV Index:   36 LVOT Area:     3.14 cm  RIGHT VENTRICLE RV Basal diam:  2.52 cm RV S prime:     17.40 cm/s TAPSE (M-mode): 4.0 cm LEFT ATRIUM             Index       RIGHT ATRIUM           Index LA diam:        2.90 cm 1.55 cm/m  RA Area:     14.90 cm LA Vol (A2C):   45.7 ml 24.49 ml/m RA Volume:   38.70 ml  20.74 ml/m LA Vol (A4C):   15.1 ml 8.09  ml/m LA Biplane Vol: 28.4 ml 15.22 ml/m  AORTIC VALVE                   PULMONIC VALVE AV Area (Vmax):    2.43 cm    PV Vmax:        0.66 m/s AV Area (Vmean):   2.70 cm    PV Peak grad:   1.7 mmHg AV Area (VTI):     2.73 cm    RVOT Peak grad: 3 mmHg AV Vmax:           126.00 cm/s AV Vmean:          90.450 cm/s AV VTI:            0.246 m AV Peak Grad:      6.4 mmHg AV Mean Grad:      3.5 mmHg LVOT Vmax:         97.30 cm/s LVOT Vmean:        77.800 cm/s LVOT VTI:          0.214 m LVOT/AV VTI ratio: 0.87  AORTA Ao Root diam: 3.30 cm MITRAL VALVE                TRICUSPID VALVE MV Area (PHT): 2.41 cm     TR Peak grad:   19.4 mmHg MV Decel Time: 315 msec     TR Vmax:        220.00 cm/s MV E velocity: 55.30 cm/s MV A velocity: 111.00 cm/s  SHUNTS MV E/A ratio:  0.50         Systemic VTI:  0.21 m                             Systemic Diam: 2.00 cm Julien Nordmann MD Electronically signed by Julien Nordmann MD Signature Date/Time: 11/25/2019/3:43:46 PM    Final         Scheduled Meds: . aspirin EC  81 mg Oral Daily  . atorvastatin  40 mg Oral QHS  . budesonide (PULMICORT) nebulizer solution  0.5 mg Nebulization BID  . buPROPion  150 mg Oral Daily  . cholecalciferol  5,000 Units Oral Daily  . divalproex  250 mg Oral q morning - 10a   . divalproex  500 mg Oral QHS  . enoxaparin (LOVENOX) injection  40 mg Subcutaneous Q24H  . insulin aspart  0-5 Units Subcutaneous QHS  . insulin aspart  0-9 Units Subcutaneous TID WC  . loratadine  10 mg Oral Daily  . lurasidone  60 mg Oral QHS  . meloxicam  15 mg Oral Daily  . montelukast  10 mg Oral QHS  . nicotine  21 mg Transdermal Daily  . pantoprazole  40 mg Oral Daily  . propranolol ER  80 mg Oral Daily   Continuous Infusions: . sodium chloride 75 mL/hr at 11/26/19 0626     LOS: 0 days    Time spent: 34 mins     Charise Killian, MD [facility] Pager [phone]  If 7PM-7AM, please contact night-coverage [email or web address] 11/26/2019, 10:23 AM

## 2019-11-26 NOTE — Progress Notes (Signed)
SLP Cancellation Note  Patient Details Name: Noha Karasik MRN: [id number] DOB: 08/23/1955   Cancelled treatment:       Reason Eval/Treat Not Completed: SLP screened, no needs identified, will sign off(chart reviewed; consulted NSG)   Pt denied any difficulty swallowing and is currently on a regular diet; tolerates swallowing pills w/ water, meals per NSG. Pt conversed in conversation w/ SLP and NA in room as he came from the bathroom to his chair; he indicated his wishes for something to drink and verbalized preference of juices. No gross communication deficits noted; pt and NSG denied any speech-language deficits this morning. Noted Neurology note. No further skilled ST services indicated as pt appears at his baseline. Recommend f/u at pt's home setting if any changes are noted from his Baseline. Pt and NSG agreed. NSG to reconsult if any change in status while admitted.    Jerilynn Som, MS, CCC-SLP Franki Alcaide 11/26/2019, 10:57 AM

## 2019-11-26 NOTE — Progress Notes (Signed)
Subjective:  Appears back to baseline.    Past Medical History:  Diagnosis Date  . Asthma   . Cardiomegaly   . Hyperlipemia   . Hypertension   . Schizophrenia (HCC)     History reviewed. No pertinent surgical history.  History reviewed. No pertinent family history.  Social History:  reports that he has been smoking cigarettes. He has never used smokeless tobacco. He reports previous alcohol use. He reports previous drug use.  No Known Allergies  Medications: I have reviewed the patient's current medications. Prior to Admission:  Medications Prior to Admission  Medication Sig Dispense Refill Last Dose  . acetaminophen (TYLENOL) 500 MG tablet Take 1,000 mg by mouth every 8 (eight) hours as needed for mild pain.   PRN at PRN  . aspirin EC 81 MG tablet Take 81 mg by mouth daily.   11/25/2019 at Unknown time  . atorvastatin (LIPITOR) 40 MG tablet Take 40 mg by mouth at bedtime.   11/24/2019 at Unknown time  . buPROPion (WELLBUTRIN XL) 150 MG 24 hr tablet Take 150 mg by mouth daily.   11/25/2019 at Unknown time  . cholecalciferol (VITAMIN D3) 25 MCG (1000 UNIT) tablet Take 5,000 Units by mouth daily.   11/25/2019 at Unknown time  . divalproex (DEPAKOTE) 250 MG DR tablet Take 250-500 mg by mouth 2 (two) times daily. 250MG -AM 500MG -PM   11/25/2019 at Unknown time  . empagliflozin (JARDIANCE) 25 MG TABS tablet Take 25 mg by mouth daily.   11/25/2019 at Unknown time  . fluticasone (FLOVENT HFA) 220 MCG/ACT inhaler Inhale 2 puffs into the lungs 2 (two) times daily.   PRN at PRN  . loratadine (CLARITIN) 10 MG tablet Take 10 mg by mouth daily.   11/25/2019 at Unknown time  . losartan (COZAAR) 50 MG tablet Take 50 mg by mouth daily.   11/25/2019 at Unknown time  . Lurasidone HCl 60 MG TABS Take 60 mg by mouth at bedtime.   11/24/2019 at Unknown time  . meloxicam (MOBIC) 15 MG tablet Take 15 mg by mouth daily.   11/25/2019 at Unknown time  . metFORMIN (GLUCOPHAGE) 500 MG tablet Take 1 tablet (500 mg total) by  mouth daily with breakfast. (Patient taking differently: Take 500 mg by mouth 2 (two) times daily with a meal. ) 30 tablet 0 11/25/2019 at Unknown time  . montelukast (SINGULAIR) 10 MG tablet Take 10 mg by mouth at bedtime. WITH CLARITIN   11/25/2019 at Unknown time  . omeprazole (PRILOSEC) 20 MG capsule Take 20 mg by mouth daily.   11/25/2019 at Unknown time  . propranolol ER (INDERAL LA) 80 MG 24 hr capsule Take 80 mg by mouth daily.   11/25/2019 at Unknown time    ROS: Slight baseline confusion   Physical Examination: Blood pressure (!) 180/83, pulse 69, temperature 97.8 F (36.6 C), temperature source Oral, resp. rate 18, height 5\' 6"  (1.676 m), weight 77.1 kg, SpO2 95 %.   Neurological Examination   Mental Status: Alert, oriented to place and location  Cranial Nerves: II: Discs flat bilaterally; Visual fields grossly normal, pupils equal, round, reactive to light and accommodation III,IV, VI: ptosis not present, extra-ocular motions intact bilaterally V,VII: smile symmetric, facial light touch sensation normal bilaterally VIII: hearing normal bilaterally Motor: Right : Upper extremity   5/5    Left:     Upper extremity   5/5  Lower extremity   5/5     Lower extremity   5/5 Tone and bulk:normal  tone throughout; no atrophy noted Sensory: Pinprick and light touch intact throughout, bilaterally Deep Tendon Reflexes: 2+ and symmetric throughout Plantars: Right: downgoing   Left: downgoing Cerebellar: normal finger-to-nose      Laboratory Studies:   Basic Metabolic Panel: Recent Labs  Lab 11/25/19 0814 11/26/19 1045  NA 140 139  K 4.8 3.9  CL 104 107  CO2 24 23  GLUCOSE 127* 133*  BUN 11 11  CREATININE 1.42* 0.99  CALCIUM 9.6 9.8    Liver Function Tests: No results for input(s): AST, ALT, ALKPHOS, BILITOT, PROT, ALBUMIN in the last 168 hours. No results for input(s): LIPASE, AMYLASE in the last 168 hours. No results for input(s): AMMONIA in the last 168  hours.  CBC: Recent Labs  Lab 11/25/19 0814 11/26/19 1045  WBC 10.9* 9.3  NEUTROABS 6.4  --   HGB 15.9 14.2  HCT 50.2 44.2  MCV 91.9 89.3  PLT 249 228    Cardiac Enzymes: No results for input(s): CKTOTAL, CKMB, CKMBINDEX, TROPONINI in the last 168 hours.  BNP: Invalid input(s): POCBNP  CBG: Recent Labs  Lab 11/25/19 1519 11/25/19 1911 11/25/19 2210 11/26/19 0743  GLUCAP 91 85 87 88    Microbiology: Results for orders placed or performed during the hospital encounter of 11/25/19  SARS CORONAVIRUS 2 (TAT 6-24 HRS) Nasopharyngeal Nasopharyngeal Swab     Status: None   Collection Time: 11/25/19  9:06 AM   Specimen: Nasopharyngeal Swab  Result Value Ref Range Status   SARS Coronavirus 2 NEGATIVE NEGATIVE Final    Comment: (NOTE) SARS-CoV-2 target nucleic acids are NOT DETECTED. The SARS-CoV-2 RNA is generally detectable in upper and lower respiratory specimens during the acute phase of infection. Negative results do not preclude SARS-CoV-2 infection, do not rule out co-infections with other pathogens, and should not be used as the sole basis for treatment or other patient management decisions. Negative results must be combined with clinical observations, patient history, and epidemiological information. The expected result is Negative. Fact Sheet for Patients: HairSlick.no Fact Sheet for Healthcare Providers: quierodirigir.com This test is not yet approved or cleared by the [place] FDA and  has been authorized for detection and/or diagnosis of SARS-CoV-2 by FDA under an Emergency Use Authorization (EUA). This EUA will remain  in effect (meaning this test can be used) for the duration of the COVID-19 declaration under Section 56 4(b)(1) of the Act, 21 U.S.C. section 360bbb-3(b)(1), unless the authorization is terminated or revoked sooner. Performed at [facility], [address] [place],  [place] [address]     Coagulation Studies: No results for input(s): LABPROT, INR in the last 72 hours.  Urinalysis: No results for input(s): COLORURINE, LABSPEC, PHURINE, GLUCOSEU, HGBUR, BILIRUBINUR, KETONESUR, PROTEINUR, UROBILINOGEN, NITRITE, LEUKOCYTESUR in the last 168 hours.  Invalid input(s): APPERANCEUR  Lipid Panel:     Component Value Date/Time   CHOL 129 11/26/2019 0455   TRIG 486 (H) 11/26/2019 0455   HDL 22 (L) 11/26/2019 0455   CHOLHDL 5.9 11/26/2019 0455   VLDL UNABLE TO CALCULATE IF TRIGLYCERIDE OVER 400 mg/dL 60/45/4098 1191   LDLCALC UNABLE TO CALCULATE IF TRIGLYCERIDE OVER 400 mg/dL 47/82/9562 1308    MVHQ4O: No results found for: HGBA1C  Urine Drug Screen:  No results found for: LABOPIA, COCAINSCRNUR, LABBENZ, AMPHETMU, THCU, LABBARB  Alcohol Level: No results for input(s): ETH in the last 168 hours.  Other results: EKG: normal EKG, normal sinus rhythm, unchanged from previous tracings.  Imaging: CT Head Wo Contrast  Result Date: 11/25/2019 CLINICAL DATA:  Altered mental status, confusion EXAM: CT HEAD WITHOUT CONTRAST TECHNIQUE: Contiguous axial images were obtained from the base of the skull through the vertex without intravenous contrast. COMPARISON:  None. FINDINGS: Brain: Mild brain atrophy pattern and minimal chronic white matter microvascular ischemic changes about the lateral ventricles bilaterally. No acute intracranial hemorrhage, mass lesion, new infarction, midline shift, herniation, hydrocephalus, or extra-axial fluid collection. No focal mass effect or edema. Cisterns are patent. Mild cerebellar atrophy as well. Vascular: Intracranial atherosclerosis at the skull base. No hyperdense vessel. Skull: Normal. Negative for fracture or focal lesion. Sinuses/Orbits: No acute finding. Other: None. IMPRESSION: Atrophy and chronic white matter microvascular ischemic changes. No acute intracranial abnormality by noncontrast CT. Electronically Signed   By: Jerilynn Mages.  Shick M.D.    On: 11/25/2019 08:36   MR ANGIO HEAD WO CONTRAST  Result Date: 11/26/2019 CLINICAL DATA:  Initial evaluation for transient ischemic attack. EXAM: MRI HEAD WITHOUT CONTRAST MRA HEAD WITHOUT CONTRAST TECHNIQUE: Multiplanar, multiecho pulse sequences of the brain and surrounding structures were obtained without intravenous contrast. Angiographic images of the head were obtained using MRA technique without contrast. COMPARISON:  Prior head CT from 11/25/2019. FINDINGS: MRI HEAD FINDINGS Brain: Cerebral volume within normal limits for patient age. Few scattered foci of subcentimeter T2/FLAIR hyperintensity noted involving the periventricular deep white matter both cerebral hemispheres, nonspecific, but felt to be within normal limits for age. No abnormal foci of restricted diffusion to suggest acute or subacute ischemia. Gray-white matter differentiation well maintained. No encephalomalacia to suggest chronic infarction. No foci of susceptibility artifact to suggest acute or chronic intracranial hemorrhage. No mass lesion, midline shift or mass effect. No hydrocephalus. No extra-axial fluid collection. Major dural sinuses are grossly patent. Pituitary gland and suprasellar region are normal. Midline structures intact and normal. Vascular: Major intracranial vascular flow voids well maintained and normal in appearance. Skull and upper cervical spine: Craniocervical junction normal. Visualized upper cervical spine within normal limits. Bone marrow signal intensity normal. No scalp soft tissue abnormality. Sinuses/Orbits: Globes and orbital soft tissues within normal limits. Mild scattered mucosal thickening noted throughout the ethmoidal air cells and maxillary sinuses. Paranasal sinuses are otherwise clear. No mastoid effusion. Inner ear structures normal. Other: None. MRA HEAD FINDINGS ANTERIOR CIRCULATION: Visualized distal cervical segments of the internal carotid arteries are widely patent with symmetric  antegrade flow. Petrous, cavernous, and supraclinoid ICAs widely patent without stenosis or other abnormality. A1 segments widely patent. Normal anterior communicating artery complex. Anterior cerebral arteries widely patent to their distal aspects without stenosis. No M1 stenosis or occlusion. Normal MCA bifurcations. Distal MCA branches well perfused and symmetric. POSTERIOR CIRCULATION: Both vertebral arteries widely patent to the vertebrobasilar junction. Neither PICA origin visualized. Basilar widely patent to its distal aspect without stenosis. Superior cerebral arteries patent bilaterally. Both PCAs primarily supplied via the basilar and are well perfused to their distal aspects. No intracranial aneurysm. IMPRESSION: 1. Normal brain MRI for age. No acute intracranial abnormality identified. 2. Normal intracranial MRA. Electronically Signed   By: Jeannine Boga M.D.   On: 11/26/2019 02:35   MR BRAIN WO CONTRAST  Result Date: 11/26/2019 CLINICAL DATA:  Initial evaluation for transient ischemic attack. EXAM: MRI HEAD WITHOUT CONTRAST MRA HEAD WITHOUT CONTRAST TECHNIQUE: Multiplanar, multiecho pulse sequences of the brain and surrounding structures were obtained without intravenous contrast. Angiographic images of the head were obtained using MRA technique without contrast. COMPARISON:  Prior head CT from 11/25/2019. FINDINGS: MRI HEAD FINDINGS Brain: Cerebral volume  within normal limits for patient age. Few scattered foci of subcentimeter T2/FLAIR hyperintensity noted involving the periventricular deep white matter both cerebral hemispheres, nonspecific, but felt to be within normal limits for age. No abnormal foci of restricted diffusion to suggest acute or subacute ischemia. Gray-white matter differentiation well maintained. No encephalomalacia to suggest chronic infarction. No foci of susceptibility artifact to suggest acute or chronic intracranial hemorrhage. No mass lesion, midline shift or mass  effect. No hydrocephalus. No extra-axial fluid collection. Major dural sinuses are grossly patent. Pituitary gland and suprasellar region are normal. Midline structures intact and normal. Vascular: Major intracranial vascular flow voids well maintained and normal in appearance. Skull and upper cervical spine: Craniocervical junction normal. Visualized upper cervical spine within normal limits. Bone marrow signal intensity normal. No scalp soft tissue abnormality. Sinuses/Orbits: Globes and orbital soft tissues within normal limits. Mild scattered mucosal thickening noted throughout the ethmoidal air cells and maxillary sinuses. Paranasal sinuses are otherwise clear. No mastoid effusion. Inner ear structures normal. Other: None. MRA HEAD FINDINGS ANTERIOR CIRCULATION: Visualized distal cervical segments of the internal carotid arteries are widely patent with symmetric antegrade flow. Petrous, cavernous, and supraclinoid ICAs widely patent without stenosis or other abnormality. A1 segments widely patent. Normal anterior communicating artery complex. Anterior cerebral arteries widely patent to their distal aspects without stenosis. No M1 stenosis or occlusion. Normal MCA bifurcations. Distal MCA branches well perfused and symmetric. POSTERIOR CIRCULATION: Both vertebral arteries widely patent to the vertebrobasilar junction. Neither PICA origin visualized. Basilar widely patent to its distal aspect without stenosis. Superior cerebral arteries patent bilaterally. Both PCAs primarily supplied via the basilar and are well perfused to their distal aspects. No intracranial aneurysm. IMPRESSION: 1. Normal brain MRI for age. No acute intracranial abnormality identified. 2. Normal intracranial MRA. Electronically Signed   By: Rise Mu M.D.   On: 11/26/2019 02:35   US Carotid Bilateral (at [facility] and [facility] only)  Result Date: 11/26/2019 CLINICAL DATA:  TIA, confusion, slurred speech, left facial droop. Hypertension,  hyperlipidemia, diabetes, previous tobacco abuse EXAM: BILATERAL CAROTID DUPLEX ULTRASOUND TECHNIQUE: Wallace Cullens scale imaging, color Doppler and duplex ultrasound were performed of bilateral carotid and vertebral arteries in the neck. COMPARISON:  None. FINDINGS: Criteria: Quantification of carotid stenosis is based on velocity parameters that correlate the residual internal carotid diameter with NASCET-based stenosis levels, using the diameter of the distal internal carotid lumen as the denominator for stenosis measurement. The following velocity measurements were obtained: RIGHT ICA: 45/15 cm/sec CCA: 67/12 cm/sec SYSTOLIC ICA/CCA RATIO:  0.7 ECA: 77 cm/sec LEFT ICA: 58/22 cm/sec CCA: 64/12 cm/sec SYSTOLIC ICA/CCA RATIO:  0.9 ECA: 95 cm/sec RIGHT CAROTID ARTERY: Intimal thickening in the common carotid artery. Partially calcified plaque in the proximal ICA with only mild stenosis. Normal waveforms and color Doppler signal. RIGHT VERTEBRAL ARTERY:  Normal flow direction and waveform. LEFT CAROTID ARTERY: Eccentric partially calcified plaque in the bulb, proximal ECA, and proximal ICA. No high-grade stenosis. Normal waveforms and color Doppler signal. LEFT VERTEBRAL ARTERY:  Normal flow direction and waveform. IMPRESSION: 1. Bilateral carotid bifurcation plaque resulting in less than 50% diameter ICA stenosis. 2. Antegrade bilateral vertebral arterial flow. Electronically Signed   By: Corlis Leak M.D.   On: 11/26/2019 08:30   ECHOCARDIOGRAM COMPLETE  Result Date: 11/25/2019    ECHOCARDIOGRAM REPORT   Patient Name:   Jay Garrett Date of Exam: 11/25/2019 Medical Rec #:  [id number]     Height:       66.0 in Accession #:  [id number]    Weight:       170.0 lb Date of Birth:  02-25-1956     BSA:          1.866 m Patient Age:    63 years      BP:           145/72 mmHg Patient Gender: M             HR:           66 bpm. Exam Location:  [facility] Procedure: 2D Echo, Cardiac Doppler and Color Doppler Indications:     TIA 435.9   History:         Patient has no prior history of Echocardiogram examinations.                  Risk Factors:Hypertension. Cardiomegaly , schizophrenia.  Sonographer:     Cristela Blue RDCS (AE) Referring Phys:  [id number] Brien Few NIU Diagnosing Phys: Julien Nordmann MD IMPRESSIONS  1. Left ventricular ejection fraction, by estimation, is 60 to 65%. The left ventricle has normal function. The left ventricle has no regional wall motion abnormalities. There is mild left ventricular hypertrophy. Left ventricular diastolic parameters are consistent with Grade I diastolic dysfunction (impaired relaxation).  2. Right ventricular systolic function is normal. The right ventricular size is normal. There is normal pulmonary artery systolic pressure.  3. The aortic valve was not well visualized. Unable to determine number of leaflets. Aortic valve regurgitation is mild. Mild aortic valve sclerosis is present, with no evidence of aortic valve stenosis. FINDINGS  Left Ventricle: Left ventricular ejection fraction, by estimation, is 60 to 65%. The left ventricle has normal function. The left ventricle has no regional wall motion abnormalities. The left ventricular internal cavity size was normal in size. There is  mild left ventricular hypertrophy. Left ventricular diastolic parameters are consistent with Grade I diastolic dysfunction (impaired relaxation). Right Ventricle: The right ventricular size is normal. No increase in right ventricular wall thickness. Right ventricular systolic function is normal. There is normal pulmonary artery systolic pressure. The tricuspid regurgitant velocity is 2.20 m/s, and  with an assumed right atrial pressure of 5 mmHg, the estimated right ventricular systolic pressure is 24.4 mmHg. Left Atrium: Left atrial size was normal in size. Right Atrium: Right atrial size was normal in size. Pericardium: There is no evidence of pericardial effusion. Mitral Valve: The mitral valve is normal in structure. Normal  mobility of the mitral valve leaflets. No evidence of mitral valve regurgitation. No evidence of mitral valve stenosis. Tricuspid Valve: The tricuspid valve is normal in structure. Tricuspid valve regurgitation is not demonstrated. No evidence of tricuspid stenosis. Aortic Valve: The aortic valve was not well visualized. Aortic valve regurgitation is mild. Mild aortic valve sclerosis is present, with no evidence of aortic valve stenosis. Aortic valve mean gradient measures 3.5 mmHg. Aortic valve peak gradient measures 6.4 mmHg. Aortic valve area, by VTI measures 2.73 cm. Pulmonic Valve: The pulmonic valve was normal in structure. Pulmonic valve regurgitation is not visualized. No evidence of pulmonic stenosis. Aorta: The aortic root is normal in size and structure. Venous: The inferior vena cava is normal in size with greater than 50% respiratory variability, suggesting right atrial pressure of 3 mmHg. IAS/Shunts: No atrial level shunt detected by color flow Doppler.  LEFT VENTRICLE PLAX 2D LVIDd:         3.54 cm  Diastology LVIDs:         2.33 cm  LV  e' lateral:   7.07 cm/s LV PW:         1.18 cm  LV E/e' lateral: 7.8 LV IVS:        1.57 cm  LV e' medial:    4.57 cm/s LVOT diam:     2.00 cm  LV E/e' medial:  12.1 LV SV:         67 LV SV Index:   36 LVOT Area:     3.14 cm  RIGHT VENTRICLE RV Basal diam:  2.52 cm RV S prime:     17.40 cm/s TAPSE (M-mode): 4.0 cm LEFT ATRIUM             Index       RIGHT ATRIUM           Index LA diam:        2.90 cm 1.55 cm/m  RA Area:     14.90 cm LA Vol (A2C):   45.7 ml 24.49 ml/m RA Volume:   38.70 ml  20.74 ml/m LA Vol (A4C):   15.1 ml 8.09 ml/m LA Biplane Vol: 28.4 ml 15.22 ml/m  AORTIC VALVE                   PULMONIC VALVE AV Area (Vmax):    2.43 cm    PV Vmax:        0.66 m/s AV Area (Vmean):   2.70 cm    PV Peak grad:   1.7 mmHg AV Area (VTI):     2.73 cm    RVOT Peak grad: 3 mmHg AV Vmax:           126.00 cm/s AV Vmean:          90.450 cm/s AV VTI:             0.246 m AV Peak Grad:      6.4 mmHg AV Mean Grad:      3.5 mmHg LVOT Vmax:         97.30 cm/s LVOT Vmean:        77.800 cm/s LVOT VTI:          0.214 m LVOT/AV VTI ratio: 0.87  AORTA Ao Root diam: 3.30 cm MITRAL VALVE                TRICUSPID VALVE MV Area (PHT): 2.41 cm     TR Peak grad:   19.4 mmHg MV Decel Time: 315 msec     TR Vmax:        220.00 cm/s MV E velocity: 55.30 cm/s MV A velocity: 111.00 cm/s  SHUNTS MV E/A ratio:  0.50         Systemic VTI:  0.21 m                             Systemic Diam: 2.00 cm Julien Nordmann MD Electronically signed by Julien Nordmann MD Signature Date/Time: 11/25/2019/3:43:46 PM    Final      Assessment/Plan:   64 y.o. male  with medical history significant of hypertension, hyperlipidemia, diabetes mellitus, asthma, TIA, schizophrenia, cardiomegaly, tobacco abuse, CKD stage III, who presents with altered mental status, facial droop, slurred speech and dizziness from group home.  Patient was last known normal at 8 PM last night, and was noted to have left facial droop, slurred speech, confusion at about 6:45 AM today.  Patient also has dizziness.  Symptoms has gradually subsided at  arrival to the ED. Currently appears back to baseline.    - CTH no acute abnormalities - MRI brain no acute abnormalities  - On ASA 81mg  at home please continue anti platelet therapy - d/c today from neurological stand point   11/26/2019, 11:42 AM

## 2019-11-27 DIAGNOSIS — N179 Acute kidney failure, unspecified: Secondary | ICD-10-CM | POA: Diagnosis not present

## 2019-11-27 DIAGNOSIS — G459 Transient cerebral ischemic attack, unspecified: Secondary | ICD-10-CM | POA: Diagnosis not present

## 2019-11-27 LAB — BASIC METABOLIC PANEL
Anion gap: 9 (ref 5–15)
BUN: 12 mg/dL (ref 8–23)
CO2: 25 mmol/L (ref 22–32)
Calcium: 9.8 mg/dL (ref 8.9–10.3)
Chloride: 105 mmol/L (ref 98–111)
Creatinine, Ser: 0.88 mg/dL (ref 0.61–1.24)
GFR calc Af Amer: >60 mL/min (ref >60)
GFR calc non Af Amer: >60 mL/min (ref >60)
Glucose, Bld: 92 mg/dL (ref 70–99)
Potassium: 4.4 mmol/L (ref 3.5–5.1)
Sodium: 139 mmol/L (ref 135–145)

## 2019-11-27 LAB — GLUCOSE, CAPILLARY: Glucose-Capillary: 86 mg/dL (ref 70–99)

## 2019-11-27 LAB — CBC
HCT: 45.1 % (ref 39.0–52.0)
Hemoglobin: 14.6 g/dL (ref 13.0–17.0)
MCH: 28.5 pg (ref 26.0–34.0)
MCHC: 32.4 g/dL (ref 30.0–36.0)
MCV: 88.1 fL (ref 80.0–100.0)
Platelets: 261 10*3/uL (ref 150–400)
RBC: 5.12 MIL/uL (ref 4.22–5.81)
RDW: 13.3 % (ref 11.5–15.5)
WBC: 9.5 10*3/uL (ref 4.0–10.5)
nRBC: 0 % (ref 0.0–0.2)

## 2019-11-27 NOTE — Progress Notes (Signed)
Yellow Mews protocol continued at shift shift change. Increased monitoring and vital sign frequency initiated at 1600 on 11/26/2019       11/26/19 1955  Assess: MEWS Score  Temp 98.6 F (37 C)  BP (!) 172/78  Resp 16  Level of Consciousness Alert  SpO2 96 %  O2 Device Room Air  Assess: MEWS Score  MEWS Temp 0  MEWS Systolic 0  MEWS Pulse 0  MEWS RR 0  MEWS LOC 0  MEWS Score 0  MEWS Score Color Green  Assess: if the MEWS score is Yellow or Red  Were vital signs taken at a resting state? Yes  MEWS guidelines implemented *See Row Information* No, other (Comment) (Continued from previous shift)  Treat  MEWS Interventions Other (Comment) (notified NP BP remains elevated )  Take Vital Signs  Increase Vital Sign Frequency   (on Mews yellow protocol currently)  Notify: Charge Nurse/RN  Name of Charge Nurse/RN Notified Aura Camps RN  Date Charge Nurse/RN Notified 11/26/19  Time Charge Nurse/RN Notified 1905 (with huddle annocuments)  Notify: Provider  Provider Name/Title Manuela Schwartz, NP  Date Provider Notified 11/26/19  Time Provider Notified 2015  Notification Type  ([employer] messaging system)  Response See new orders (metroprolol IV once)  Date of Provider Response 11/26/19  Time of Provider Response 2025  Document  Patient Outcome Stabilized after interventions (BP decreased, continuing to monitor)  Progress note created (see row info) Yes

## 2019-11-27 NOTE — Discharge Summary (Signed)
Physician Discharge Summary  Jay Garrett ZOX:[id number] DOB: 1956/06/20 DOA: 11/25/2019  PCP: Martin Majestic, FNP  Admit date: 11/25/2019 Discharge date: 11/27/2019  Admitted From: group home Disposition:  Group home   Recommendations for Outpatient Follow-up:  1. Follow up with PCP in 1-2 weeks   Home Health: no Equipment/Devices: n/a  Discharge Condition: stable  CODE STATUS: full  Diet recommendation: Heart Healthy / Carb Modified  Brief/Interim Summary: HPI was taken from Dr. Blaine Hamper: Jay Garrett is a 64 y.o. male with medical history significant of hypertension, hyperlipidemia, diabetes mellitus, asthma, TIA, schizophrenia, cardiomegaly, tobacco abuse, CKD stage III, who presents with altered mental status, facial droop, slurred speech and dizziness.  Per report, patient was last known normal at 8 PM last night, and was noted to have left facial droop, slurred speech, confusion at about 6:45 AM today.  Patient also has dizziness.  Symptoms has gradually subsided at arrival to the ED. Currently patient does not have unilateral weakness or numbness in extremities.  No facial droop or slurred speech. Patient denies chest pain, cough, shortness breath, fever or chills.  No symptoms of UTI.  Denies nausea, vomiting, diarrhea or abdominal pain.  ED Course: pt was found to have WBC 10.9, troponin VI, pending COVID-19 PCR, slightly worsening renal function, temperature normal, blood pressure 158/97, heart rate 77, RR 18, oxygen saturation 99% on room air.  CT head is negative for acute intracranial abnormalities. Pt is placed on MedSurg for observation.  Dr. Irish Elders for neurology is consulted.  Hospital Course from Dr. Lenise Herald 5/8-11/27/19: Pt presented w/ acute metabolic encephalopathy likely secondary to TIA. CT head, MRI/MRA brain all did not show any acute intracranial abnormalities. The slurred speech & facial droop resolved prior to d/c. Echo was done which showed EF 60-65%,  grade I diastolic dysfunction. Carotid US showed b/l carotid bifurcation plaque resulting in less than 50% ICA stenosis. PT did not have any recommendations for the pt as the pt was ambulating and transferring independently.   Discharge Diagnoses:  Principal Problem:   Acute metabolic encephalopathy Active Problems:   Asthma   Hyperlipemia   Hypertension   Schizophrenia ([place])   Facial droop   Slurred speech   Type II diabetes mellitus with renal manifestations (HCC)   Acute renal failure superimposed on stage 3a chronic kidney disease (HCC)   TIA (transient ischemic attack)  Acute metabolic encephalopathy: etiology unclear, possible TIA as pt has facial droop and slurred speech. Oriented to person, place only- unknown baseline. CT head is negative for acute intracranial abnormalities. MRI brain & MRA brain are both normal. Echo shows EF 81-19%, grade I diastolic dysfunction. Carotid US b/l showed less 50% stenosis. Neuro following and recs apprec. PT/OT consulted   Hx of TIA: continue on aspirin and lipitor  Asthma: stable. Continue on bronchodilators  singulair  Hyperlipemia: continue on statin   HTN: continue on propranolol. Restart Cozaar.Marland Kitchen Hydralazine prn  Schizophrenia: unknown type or severity. Continue Wellbutrin, Depakote, lurasidone  DM2 with renal manifestations: well controlled. Hold home dose of jardiance and metformin while inpatient & restart at d/c. Continue on SSI w/ accuchecks while inpatient only. HbA1c 5.2  AKI on CKDIIIa: baseline creatinine 1.27 on 02/02/2018. Resolved  Discharge Instructions  Discharge Instructions    Diet - low sodium heart healthy   Complete by: As directed    Diet Carb Modified   Complete by: As directed    Discharge instructions   Complete by: As directed    F/U  w/ PCP in 1-2 weeks   Increase activity slowly   Complete by: As directed      Allergies as of 11/27/2019   No Known Allergies     Medication List    TAKE  these medications   acetaminophen 500 MG tablet Commonly known as: TYLENOL Take 1,000 mg by mouth every 8 (eight) hours as needed for mild pain.   aspirin EC 81 MG tablet Take 81 mg by mouth daily.   atorvastatin 40 MG tablet Commonly known as: LIPITOR Take 40 mg by mouth at bedtime.   buPROPion 150 MG 24 hr tablet Commonly known as: WELLBUTRIN XL Take 150 mg by mouth daily.   cholecalciferol 25 MCG (1000 UNIT) tablet Commonly known as: VITAMIN D3 Take 5,000 Units by mouth daily.   divalproex 250 MG DR tablet Commonly known as: DEPAKOTE Take 250-500 mg by mouth 2 (two) times daily. -AM -PM   fluticasone 220 MCG/ACT inhaler Commonly known as: FLOVENT HFA Inhale 2 puffs into the lungs 2 (two) times daily.   Jardiance 25 MG Tabs tablet Generic drug: empagliflozin Take 25 mg by mouth daily.   loratadine 10 MG tablet Commonly known as: CLARITIN Take 10 mg by mouth daily.   losartan 50 MG tablet Commonly known as: COZAAR Take 50 mg by mouth daily.   Lurasidone HCl 60 MG Tabs Take 60 mg by mouth at bedtime.   meloxicam 15 MG tablet Commonly known as: MOBIC Take 15 mg by mouth daily.   metFORMIN 500 MG tablet Commonly known as: Glucophage Take 1 tablet (500 mg total) by mouth daily with breakfast. What changed: when to take this   montelukast 10 MG tablet Commonly known as: SINGULAIR Take 10 mg by mouth at bedtime. WITH CLARITIN   omeprazole 20 MG capsule Commonly known as: PRILOSEC Take 20 mg by mouth daily.   propranolol ER 80 MG 24 hr capsule Commonly known as: INDERAL LA Take 80 mg by mouth daily.       No Known Allergies  Consultations:  neuro   Procedures/Studies: CT Head Wo Contrast  Result Date: 11/25/2019 CLINICAL DATA:  Altered mental status, confusion EXAM: CT HEAD WITHOUT CONTRAST TECHNIQUE: Contiguous axial images were obtained from the base of the skull through the vertex without intravenous contrast. COMPARISON:  None.  FINDINGS: Brain: Mild brain atrophy pattern and minimal chronic white matter microvascular ischemic changes about the lateral ventricles bilaterally. No acute intracranial hemorrhage, mass lesion, new infarction, midline shift, herniation, hydrocephalus, or extra-axial fluid collection. No focal mass effect or edema. Cisterns are patent. Mild cerebellar atrophy as well. Vascular: Intracranial atherosclerosis at the skull base. No hyperdense vessel. Skull: Normal. Negative for fracture or focal lesion. Sinuses/Orbits: No acute finding. Other: None. IMPRESSION: Atrophy and chronic white matter microvascular ischemic changes. No acute intracranial abnormality by noncontrast CT. Electronically Signed   By: Judie Petit.  Shick M.D.   On: 11/25/2019 08:36   MR ANGIO HEAD WO CONTRAST  Result Date: 11/26/2019 CLINICAL DATA:  Initial evaluation for transient ischemic attack. EXAM: MRI HEAD WITHOUT CONTRAST MRA HEAD WITHOUT CONTRAST TECHNIQUE: Multiplanar, multiecho pulse sequences of the brain and surrounding structures were obtained without intravenous contrast. Angiographic images of the head were obtained using MRA technique without contrast. COMPARISON:  Prior head CT from 11/25/2019. FINDINGS: MRI HEAD FINDINGS Brain: Cerebral volume within normal limits for patient age. Few scattered foci of subcentimeter T2/FLAIR hyperintensity noted involving the periventricular deep white matter both cerebral hemispheres, nonspecific, but felt to be within normal  limits for age. No abnormal foci of restricted diffusion to suggest acute or subacute ischemia. Gray-white matter differentiation well maintained. No encephalomalacia to suggest chronic infarction. No foci of susceptibility artifact to suggest acute or chronic intracranial hemorrhage. No mass lesion, midline shift or mass effect. No hydrocephalus. No extra-axial fluid collection. Major dural sinuses are grossly patent. Pituitary gland and suprasellar region are normal. Midline  structures intact and normal. Vascular: Major intracranial vascular flow voids well maintained and normal in appearance. Skull and upper cervical spine: Craniocervical junction normal. Visualized upper cervical spine within normal limits. Bone marrow signal intensity normal. No scalp soft tissue abnormality. Sinuses/Orbits: Globes and orbital soft tissues within normal limits. Mild scattered mucosal thickening noted throughout the ethmoidal air cells and maxillary sinuses. Paranasal sinuses are otherwise clear. No mastoid effusion. Inner ear structures normal. Other: None. MRA HEAD FINDINGS ANTERIOR CIRCULATION: Visualized distal cervical segments of the internal carotid arteries are widely patent with symmetric antegrade flow. Petrous, cavernous, and supraclinoid ICAs widely patent without stenosis or other abnormality. A1 segments widely patent. Normal anterior communicating artery complex. Anterior cerebral arteries widely patent to their distal aspects without stenosis. No M1 stenosis or occlusion. Normal MCA bifurcations. Distal MCA branches well perfused and symmetric. POSTERIOR CIRCULATION: Both vertebral arteries widely patent to the vertebrobasilar junction. Neither PICA origin visualized. Basilar widely patent to its distal aspect without stenosis. Superior cerebral arteries patent bilaterally. Both PCAs primarily supplied via the basilar and are well perfused to their distal aspects. No intracranial aneurysm. IMPRESSION: 1. Normal brain MRI for age. No acute intracranial abnormality identified. 2. Normal intracranial MRA. Electronically Signed   By: Rise Mu M.D.   On: 11/26/2019 02:35   MR BRAIN WO CONTRAST  Result Date: 11/26/2019 CLINICAL DATA:  Initial evaluation for transient ischemic attack. EXAM: MRI HEAD WITHOUT CONTRAST MRA HEAD WITHOUT CONTRAST TECHNIQUE: Multiplanar, multiecho pulse sequences of the brain and surrounding structures were obtained without intravenous contrast.  Angiographic images of the head were obtained using MRA technique without contrast. COMPARISON:  Prior head CT from 11/25/2019. FINDINGS: MRI HEAD FINDINGS Brain: Cerebral volume within normal limits for patient age. Few scattered foci of subcentimeter T2/FLAIR hyperintensity noted involving the periventricular deep white matter both cerebral hemispheres, nonspecific, but felt to be within normal limits for age. No abnormal foci of restricted diffusion to suggest acute or subacute ischemia. Gray-white matter differentiation well maintained. No encephalomalacia to suggest chronic infarction. No foci of susceptibility artifact to suggest acute or chronic intracranial hemorrhage. No mass lesion, midline shift or mass effect. No hydrocephalus. No extra-axial fluid collection. Major dural sinuses are grossly patent. Pituitary gland and suprasellar region are normal. Midline structures intact and normal. Vascular: Major intracranial vascular flow voids well maintained and normal in appearance. Skull and upper cervical spine: Craniocervical junction normal. Visualized upper cervical spine within normal limits. Bone marrow signal intensity normal. No scalp soft tissue abnormality. Sinuses/Orbits: Globes and orbital soft tissues within normal limits. Mild scattered mucosal thickening noted throughout the ethmoidal air cells and maxillary sinuses. Paranasal sinuses are otherwise clear. No mastoid effusion. Inner ear structures normal. Other: None. MRA HEAD FINDINGS ANTERIOR CIRCULATION: Visualized distal cervical segments of the internal carotid arteries are widely patent with symmetric antegrade flow. Petrous, cavernous, and supraclinoid ICAs widely patent without stenosis or other abnormality. A1 segments widely patent. Normal anterior communicating artery complex. Anterior cerebral arteries widely patent to their distal aspects without stenosis. No M1 stenosis or occlusion. Normal MCA bifurcations. Distal MCA branches well  perfused  and symmetric. POSTERIOR CIRCULATION: Both vertebral arteries widely patent to the vertebrobasilar junction. Neither PICA origin visualized. Basilar widely patent to its distal aspect without stenosis. Superior cerebral arteries patent bilaterally. Both PCAs primarily supplied via the basilar and are well perfused to their distal aspects. No intracranial aneurysm. IMPRESSION: 1. Normal brain MRI for age. No acute intracranial abnormality identified. 2. Normal intracranial MRA. Electronically Signed   By: Rise Mu M.D.   On: 11/26/2019 02:35   US Carotid Bilateral (at [facility] and [facility] only)  Result Date: 11/26/2019 CLINICAL DATA:  TIA, confusion, slurred speech, left facial droop. Hypertension, hyperlipidemia, diabetes, previous tobacco abuse EXAM: BILATERAL CAROTID DUPLEX ULTRASOUND TECHNIQUE: Wallace Cullens scale imaging, color Doppler and duplex ultrasound were performed of bilateral carotid and vertebral arteries in the neck. COMPARISON:  None. FINDINGS: Criteria: Quantification of carotid stenosis is based on velocity parameters that correlate the residual internal carotid diameter with NASCET-based stenosis levels, using the diameter of the distal internal carotid lumen as the denominator for stenosis measurement. The following velocity measurements were obtained: RIGHT ICA: 45/15 cm/sec CCA: 67/12 cm/sec SYSTOLIC ICA/CCA RATIO:  0.7 ECA: 77 cm/sec LEFT ICA: 58/22 cm/sec CCA: 64/12 cm/sec SYSTOLIC ICA/CCA RATIO:  0.9 ECA: 95 cm/sec RIGHT CAROTID ARTERY: Intimal thickening in the common carotid artery. Partially calcified plaque in the proximal ICA with only mild stenosis. Normal waveforms and color Doppler signal. RIGHT VERTEBRAL ARTERY:  Normal flow direction and waveform. LEFT CAROTID ARTERY: Eccentric partially calcified plaque in the bulb, proximal ECA, and proximal ICA. No high-grade stenosis. Normal waveforms and color Doppler signal. LEFT VERTEBRAL ARTERY:  Normal flow direction and waveform.  IMPRESSION: 1. Bilateral carotid bifurcation plaque resulting in less than 50% diameter ICA stenosis. 2. Antegrade bilateral vertebral arterial flow. Electronically Signed   By: Corlis Leak M.D.   On: 11/26/2019 08:30   ECHOCARDIOGRAM COMPLETE  Result Date: 11/25/2019    ECHOCARDIOGRAM REPORT   Patient Name:   SAXTON CHAIN Date of Exam: 11/25/2019 Medical Rec #:  [id number]     Height:       66.0 in Accession #:    [id number]    Weight:       170.0 lb Date of Birth:  10-29-1955     BSA:          1.866 m Patient Age:    63 years      BP:           145/72 mmHg Patient Gender: M             HR:           66 bpm. Exam Location:  [facility] Procedure: 2D Echo, Cardiac Doppler and Color Doppler Indications:     TIA 435.9  History:         Patient has no prior history of Echocardiogram examinations.                  Risk Factors:Hypertension. Cardiomegaly , schizophrenia.  Sonographer:     Cristela Blue RDCS (AE) Referring Phys:  [id number] Brien Few NIU Diagnosing Phys: Julien Nordmann MD IMPRESSIONS  1. Left ventricular ejection fraction, by estimation, is 60 to 65%. The left ventricle has normal function. The left ventricle has no regional wall motion abnormalities. There is mild left ventricular hypertrophy. Left ventricular diastolic parameters are consistent with Grade I diastolic dysfunction (impaired relaxation).  2. Right ventricular systolic function is normal. The right ventricular size is normal. There is normal pulmonary artery systolic pressure.  3. The  aortic valve was not well visualized. Unable to determine number of leaflets. Aortic valve regurgitation is mild. Mild aortic valve sclerosis is present, with no evidence of aortic valve stenosis. FINDINGS  Left Ventricle: Left ventricular ejection fraction, by estimation, is 60 to 65%. The left ventricle has normal function. The left ventricle has no regional wall motion abnormalities. The left ventricular internal cavity size was normal in size. There is  mild left ventricular  hypertrophy. Left ventricular diastolic parameters are consistent with Grade I diastolic dysfunction (impaired relaxation). Right Ventricle: The right ventricular size is normal. No increase in right ventricular wall thickness. Right ventricular systolic function is normal. There is normal pulmonary artery systolic pressure. The tricuspid regurgitant velocity is 2.20 m/s, and  with an assumed right atrial pressure of 5 mmHg, the estimated right ventricular systolic pressure is 24.4 mmHg. Left Atrium: Left atrial size was normal in size. Right Atrium: Right atrial size was normal in size. Pericardium: There is no evidence of pericardial effusion. Mitral Valve: The mitral valve is normal in structure. Normal mobility of the mitral valve leaflets. No evidence of mitral valve regurgitation. No evidence of mitral valve stenosis. Tricuspid Valve: The tricuspid valve is normal in structure. Tricuspid valve regurgitation is not demonstrated. No evidence of tricuspid stenosis. Aortic Valve: The aortic valve was not well visualized. Aortic valve regurgitation is mild. Mild aortic valve sclerosis is present, with no evidence of aortic valve stenosis. Aortic valve mean gradient measures 3.5 mmHg. Aortic valve peak gradient measures 6.4 mmHg. Aortic valve area, by VTI measures 2.73 cm. Pulmonic Valve: The pulmonic valve was normal in structure. Pulmonic valve regurgitation is not visualized. No evidence of pulmonic stenosis. Aorta: The aortic root is normal in size and structure. Venous: The inferior vena cava is normal in size with greater than 50% respiratory variability, suggesting right atrial pressure of 3 mmHg. IAS/Shunts: No atrial level shunt detected by color flow Doppler.  LEFT VENTRICLE PLAX 2D LVIDd:         3.54 cm  Diastology LVIDs:         2.33 cm  LV e' lateral:   7.07 cm/s LV PW:         1.18 cm  LV E/e' lateral: 7.8 LV IVS:        1.57 cm  LV e' medial:    4.57 cm/s LVOT diam:     2.00 cm  LV E/e' medial:   12.1 LV SV:         67 LV SV Index:   36 LVOT Area:     3.14 cm  RIGHT VENTRICLE RV Basal diam:  2.52 cm RV S prime:     17.40 cm/s TAPSE (M-mode): 4.0 cm LEFT ATRIUM             Index       RIGHT ATRIUM           Index LA diam:        2.90 cm 1.55 cm/m  RA Area:     14.90 cm LA Vol (A2C):   45.7 ml 24.49 ml/m RA Volume:   38.70 ml  20.74 ml/m LA Vol (A4C):   15.1 ml 8.09 ml/m LA Biplane Vol: 28.4 ml 15.22 ml/m  AORTIC VALVE                   PULMONIC VALVE AV Area (Vmax):    2.43 cm    PV Vmax:        0.66  m/s AV Area (Vmean):   2.70 cm    PV Peak grad:   1.7 mmHg AV Area (VTI):     2.73 cm    RVOT Peak grad: 3 mmHg AV Vmax:           126.00 cm/s AV Vmean:          90.450 cm/s AV VTI:            0.246 m AV Peak Grad:      6.4 mmHg AV Mean Grad:      3.5 mmHg LVOT Vmax:         97.30 cm/s LVOT Vmean:        77.800 cm/s LVOT VTI:          0.214 m LVOT/AV VTI ratio: 0.87  AORTA Ao Root diam: 3.30 cm MITRAL VALVE                TRICUSPID VALVE MV Area (PHT): 2.41 cm     TR Peak grad:   19.4 mmHg MV Decel Time: 315 msec     TR Vmax:        220.00 cm/s MV E velocity: 55.30 cm/s MV A velocity: 111.00 cm/s  SHUNTS MV E/A ratio:  0.50         Systemic VTI:  0.21 m                             Systemic Diam: 2.00 cm Julien Nordmann MD Electronically signed by Julien Nordmann MD Signature Date/Time: 11/25/2019/3:43:46 PM    Final      Subjective: Pt denies any complaints    Discharge Exam: Vitals:   11/27/19 0700 11/27/19 0721  BP: (!) 114/54   Pulse: 71   Resp: 11   Temp: 98.4 F (36.9 C)   SpO2: 97% 98%   Vitals:   11/27/19 0018 11/27/19 0400 11/27/19 0700 11/27/19 0721  BP: 128/83 (!) 148/69 (!) 114/54   Pulse: 65 65 71   Resp: Temp: 98.3 F (36.8 C) 98.3 F (36.8 C) 98.4 F (36.9 C)   TempSrc: Oral Oral Oral   SpO2: 100% 95% 97% 98%  Weight:      Height:        General: Pt is alert, awake, not in acute distress Cardiovascular: S1/S2 +, no rubs, no gallops Respiratory: CTA  bilaterally, no wheezing, no rhonchi Abdominal: Soft, NT, ND, bowel sounds + Extremities: no edema, no cyanosis    The results of significant diagnostics from this hospitalization (including imaging, microbiology, ancillary and laboratory) are listed below for reference.     Microbiology: Recent Results (from the past 240 hour(s))  SARS CORONAVIRUS 2 (TAT 6-24 HRS) Nasopharyngeal Nasopharyngeal Swab     Status: None   Collection Time: 11/25/19  9:06 AM   Specimen: Nasopharyngeal Swab  Result Value Ref Range Status   SARS Coronavirus 2 NEGATIVE NEGATIVE Final    Comment: (NOTE) SARS-CoV-2 target nucleic acids are NOT DETECTED. The SARS-CoV-2 RNA is generally detectable in upper and lower respiratory specimens during the acute phase of infection. Negative results do not preclude SARS-CoV-2 infection, do not rule out co-infections with other pathogens, and should not be used as the sole basis for treatment or other patient management decisions. Negative results must be combined with clinical observations, patient history, and epidemiological information. The expected result is Negative. Fact Sheet for Patients: HairSlick.no Fact Sheet for Healthcare  Providers: quierodirigir.com This test is not yet approved or cleared by the [place] and  has been authorized for detection and/or diagnosis of SARS-CoV-2 by FDA under an Emergency Use Authorization (EUA). This EUA will remain  in effect (meaning this test can be used) for the duration of the COVID-19 declaration under Section 56 4(b)(1) of the Act, 21 U.S.C. section 360bbb-3(b)(1), unless the authorization is terminated or revoked sooner. Performed at [facility], [address] [place], [place] [address]      Labs: BNP (last 3 results) Recent Labs    11/25/19 0814  BNP 42.0   Basic Metabolic Panel: Recent Labs  Lab 11/25/19 0814 11/26/19 1045  11/27/19 0730  NA 140 139 139  K 4.8 3.9 4.4  CL 104 107 105  CO2 24 23 25   GLUCOSE 127* 133* 92  BUN 11 11 12   CREATININE 1.42* 0.99 0.88  CALCIUM 9.6 9.8 9.8   Liver Function Tests: No results for input(s): AST, ALT, ALKPHOS, BILITOT, PROT, ALBUMIN in the last 168 hours. No results for input(s): LIPASE, AMYLASE in the last 168 hours. No results for input(s): AMMONIA in the last 168 hours. CBC: Recent Labs  Lab 11/25/19 0814 11/26/19 1045 11/27/19 0730  WBC 10.9* 9.3 9.5  NEUTROABS 6.4  --   --   HGB 15.9 14.2 14.6  HCT 50.2 44.2 45.1  MCV 91.9 89.3 88.1  PLT 249 228 261   Cardiac Enzymes: No results for input(s): CKTOTAL, CKMB, CKMBINDEX, TROPONINI in the last 168 hours. BNP: Invalid input(s): POCBNP CBG: Recent Labs  Lab 11/26/19 0743 11/26/19 1144 11/26/19 1638 11/26/19 2136 11/27/19 0759  GLUCAP 88 126* 105* 117* 86   D-Dimer No results for input(s): DDIMER in the last 72 hours. Hgb A1c Recent Labs    11/26/19 0455  HGBA1C 5.2   Lipid Profile Recent Labs    11/26/19 0455  CHOL 129  HDL 22*  LDLCALC UNABLE TO CALCULATE IF TRIGLYCERIDE OVER 400 mg/dL  TRIG 01/26/20*  CHOLHDL 5.9  LDLDIRECT 68.9   Thyroid function studies No results for input(s): TSH, T4TOTAL, T3FREE, THYROIDAB in the last 72 hours.  Invalid input(s): FREET3 Anemia work up No results for input(s): VITAMINB12, FOLATE, FERRITIN, TIBC, IRON, RETICCTPCT in the last 72 hours. Urinalysis    Component Value Date/Time   COLORURINE YELLOW (A) 02/02/2018 1022   APPEARANCEUR CLEAR (A) 02/02/2018 1022   LABSPEC 1.033 (H) 02/02/2018 1022   PHURINE 6.0 02/02/2018 1022   GLUCOSEU >=500 (A) 02/02/2018 1022   HGBUR NEGATIVE 02/02/2018 1022   BILIRUBINUR NEGATIVE 02/02/2018 1022   KETONESUR 20 (A) 02/02/2018 1022   PROTEINUR NEGATIVE 02/02/2018 1022   NITRITE NEGATIVE 02/02/2018 1022   LEUKOCYTESUR NEGATIVE 02/02/2018 1022   Sepsis Labs Invalid input(s): PROCALCITONIN,  WBC,   LACTICIDVEN Microbiology Recent Results (from the past 240 hour(s))  SARS CORONAVIRUS 2 (TAT 6-24 HRS) Nasopharyngeal Nasopharyngeal Swab     Status: None   Collection Time: 11/25/19  9:06 AM   Specimen: Nasopharyngeal Swab  Result Value Ref Range Status   SARS Coronavirus 2 NEGATIVE NEGATIVE Final    Comment: (NOTE) SARS-CoV-2 target nucleic acids are NOT DETECTED. The SARS-CoV-2 RNA is generally detectable in upper and lower respiratory specimens during the acute phase of infection. Negative results do not preclude SARS-CoV-2 infection, do not rule out co-infections with other pathogens, and should not be used as the sole basis for treatment or other patient management decisions. Negative results must be combined with  clinical observations, patient history, and epidemiological information. The expected result is Negative. Fact Sheet for Patients: HairSlick.nohttps://www.fda.gov/media/138098/download Fact Sheet for Healthcare Providers: quierodirigir.comhttps://www.fda.gov/media/138095/download This test is not yet approved or cleared by the [place] FDA and  has been authorized for detection and/or diagnosis of SARS-CoV-2 by FDA under an Emergency Use Authorization (EUA). This EUA will remain  in effect (meaning this test can be used) for the duration of the COVID-19 declaration under Section 56 4(b)(1) of the Act, 21 U.S.C. section 360bbb-3(b)(1), unless the authorization is terminated or revoked sooner. Performed at [facility], [address] [place], [place] [address]      Time coordinating discharge: Over 30 minutes  SIGNED:   Charise KillianJamiese M Jaelin Devincentis, MD  [facility] 11/27/2019, 10:31 AM Pager   If 7PM-7AM, please contact night-coverage [email or web address]

## 2019-11-27 NOTE — Plan of Care (Signed)
  Problem: Education: Goal: Knowledge of General Education information will improve Description: Including pain rating scale, medication(s)/side effects and non-pharmacologic comfort measures Outcome: Adequate for Discharge   Problem: Health Behavior/Discharge Planning: Goal: Ability to manage health-related needs will improve Outcome: Adequate for Discharge   Problem: Clinical Measurements: Goal: Ability to maintain clinical measurements within normal limits will improve Outcome: Adequate for Discharge Goal: Will remain free from infection Outcome: Adequate for Discharge Goal: Diagnostic test results will improve Outcome: Adequate for Discharge Goal: Respiratory complications will improve Outcome: Adequate for Discharge Goal: Cardiovascular complication will be avoided Outcome: Adequate for Discharge   Problem: Activity: Goal: Risk for activity intolerance will decrease Outcome: Adequate for Discharge   Problem: Nutrition: Goal: Adequate nutrition will be maintained Outcome: Adequate for Discharge   Problem: Coping: Goal: Level of anxiety will decrease Outcome: Adequate for Discharge   Problem: Elimination: Goal: Will not experience complications related to bowel motility Outcome: Adequate for Discharge Goal: Will not experience complications related to urinary retention Outcome: Adequate for Discharge   Problem: Pain Managment: Goal: General experience of comfort will improve Outcome: Adequate for Discharge   Problem: Safety: Goal: Ability to remain free from injury will improve Outcome: Adequate for Discharge   Problem: Skin Integrity: Goal: Risk for impaired skin integrity will decrease Outcome: Adequate for Discharge   Problem: Education: Goal: Knowledge of disease or condition will improve Outcome: Adequate for Discharge Goal: Knowledge of secondary prevention will improve Outcome: Adequate for Discharge Goal: Knowledge of patient specific risk factors  addressed and post discharge goals established will improve Outcome: Adequate for Discharge   Problem: Coping: Goal: Will verbalize positive feelings about self Outcome: Adequate for Discharge Goal: Will identify appropriate support needs Outcome: Adequate for Discharge   Problem: Health Behavior/Discharge Planning: Goal: Ability to manage health-related needs will improve Outcome: Adequate for Discharge   Problem: Self-Care: Goal: Ability to participate in self-care as condition permits will improve Outcome: Adequate for Discharge Goal: Verbalization of feelings and concerns over difficulty with self-care will improve Outcome: Adequate for Discharge Goal: Ability to communicate needs accurately will improve Outcome: Adequate for Discharge   Problem: Nutrition: Goal: Risk of aspiration will decrease Outcome: Adequate for Discharge   Problem: Intracerebral Hemorrhage Tissue Perfusion: Goal: Complications of Intracerebral Hemorrhage will be minimized Outcome: Adequate for Discharge   Problem: Ischemic Stroke/TIA Tissue Perfusion: Goal: Complications of ischemic stroke/TIA will be minimized Outcome: Adequate for Discharge   Problem: Spontaneous Subarachnoid Hemorrhage Tissue Perfusion: Goal: Complications of Spontaneous Subarachnoid Hemorrhage will be minimized Outcome: Adequate for Discharge

## 2019-12-20 DIAGNOSIS — E785 Hyperlipidemia, unspecified: Secondary | ICD-10-CM | POA: Diagnosis not present

## 2019-12-20 DIAGNOSIS — E291 Testicular hypofunction: Secondary | ICD-10-CM | POA: Diagnosis not present

## 2019-12-20 DIAGNOSIS — E118 Type 2 diabetes mellitus with unspecified complications: Secondary | ICD-10-CM | POA: Diagnosis not present

## 2019-12-20 DIAGNOSIS — N1831 Chronic kidney disease, stage 3a: Secondary | ICD-10-CM | POA: Diagnosis not present

## 2019-12-20 DIAGNOSIS — E669 Obesity, unspecified: Secondary | ICD-10-CM | POA: Diagnosis not present

## 2019-12-20 DIAGNOSIS — N529 Male erectile dysfunction, unspecified: Secondary | ICD-10-CM | POA: Diagnosis not present

## 2019-12-20 DIAGNOSIS — Z6836 Body mass index (BMI) 36.0-36.9, adult: Secondary | ICD-10-CM | POA: Diagnosis not present

## 2019-12-20 DIAGNOSIS — M659 Synovitis and tenosynovitis, unspecified: Secondary | ICD-10-CM | POA: Diagnosis not present

## 2019-12-20 DIAGNOSIS — I10 Essential (primary) hypertension: Secondary | ICD-10-CM | POA: Diagnosis not present

## 2019-12-20 DIAGNOSIS — M159 Polyosteoarthritis, unspecified: Secondary | ICD-10-CM | POA: Diagnosis not present

## 2019-12-26 DIAGNOSIS — E669 Obesity, unspecified: Secondary | ICD-10-CM | POA: Diagnosis not present

## 2019-12-26 DIAGNOSIS — M659 Synovitis and tenosynovitis, unspecified: Secondary | ICD-10-CM | POA: Diagnosis not present

## 2019-12-26 DIAGNOSIS — E291 Testicular hypofunction: Secondary | ICD-10-CM | POA: Diagnosis not present

## 2019-12-26 DIAGNOSIS — M159 Polyosteoarthritis, unspecified: Secondary | ICD-10-CM | POA: Diagnosis not present

## 2019-12-26 DIAGNOSIS — N529 Male erectile dysfunction, unspecified: Secondary | ICD-10-CM | POA: Diagnosis not present

## 2019-12-26 DIAGNOSIS — N1831 Chronic kidney disease, stage 3a: Secondary | ICD-10-CM | POA: Diagnosis not present

## 2019-12-26 DIAGNOSIS — E785 Hyperlipidemia, unspecified: Secondary | ICD-10-CM | POA: Diagnosis not present

## 2019-12-26 DIAGNOSIS — Z6836 Body mass index (BMI) 36.0-36.9, adult: Secondary | ICD-10-CM | POA: Diagnosis not present

## 2020-01-16 DIAGNOSIS — E118 Type 2 diabetes mellitus with unspecified complications: Secondary | ICD-10-CM | POA: Diagnosis not present

## 2020-01-16 DIAGNOSIS — I10 Essential (primary) hypertension: Secondary | ICD-10-CM | POA: Diagnosis not present

## 2020-01-16 DIAGNOSIS — E291 Testicular hypofunction: Secondary | ICD-10-CM | POA: Diagnosis not present

## 2020-01-16 DIAGNOSIS — N1831 Chronic kidney disease, stage 3a: Secondary | ICD-10-CM | POA: Diagnosis not present

## 2020-01-16 DIAGNOSIS — E785 Hyperlipidemia, unspecified: Secondary | ICD-10-CM | POA: Diagnosis not present

## 2020-01-16 DIAGNOSIS — N529 Male erectile dysfunction, unspecified: Secondary | ICD-10-CM | POA: Diagnosis not present

## 2020-01-16 DIAGNOSIS — Z6836 Body mass index (BMI) 36.0-36.9, adult: Secondary | ICD-10-CM | POA: Diagnosis not present

## 2020-01-16 DIAGNOSIS — M159 Polyosteoarthritis, unspecified: Secondary | ICD-10-CM | POA: Diagnosis not present

## 2020-03-19 DIAGNOSIS — E785 Hyperlipidemia, unspecified: Secondary | ICD-10-CM | POA: Diagnosis not present

## 2020-03-19 DIAGNOSIS — N529 Male erectile dysfunction, unspecified: Secondary | ICD-10-CM | POA: Diagnosis not present

## 2020-03-19 DIAGNOSIS — I10 Essential (primary) hypertension: Secondary | ICD-10-CM | POA: Diagnosis not present

## 2020-03-19 DIAGNOSIS — E291 Testicular hypofunction: Secondary | ICD-10-CM | POA: Diagnosis not present

## 2020-03-19 DIAGNOSIS — E118 Type 2 diabetes mellitus with unspecified complications: Secondary | ICD-10-CM | POA: Diagnosis not present

## 2020-03-19 DIAGNOSIS — N1831 Chronic kidney disease, stage 3a: Secondary | ICD-10-CM | POA: Diagnosis not present

## 2020-03-19 DIAGNOSIS — Z6836 Body mass index (BMI) 36.0-36.9, adult: Secondary | ICD-10-CM | POA: Diagnosis not present

## 2020-03-19 DIAGNOSIS — M159 Polyosteoarthritis, unspecified: Secondary | ICD-10-CM | POA: Diagnosis not present

## 2020-05-21 DIAGNOSIS — E291 Testicular hypofunction: Secondary | ICD-10-CM | POA: Diagnosis not present

## 2020-05-21 DIAGNOSIS — M159 Polyosteoarthritis, unspecified: Secondary | ICD-10-CM | POA: Diagnosis not present

## 2020-05-21 DIAGNOSIS — Z23 Encounter for immunization: Secondary | ICD-10-CM | POA: Diagnosis not present

## 2020-05-21 DIAGNOSIS — I10 Essential (primary) hypertension: Secondary | ICD-10-CM | POA: Diagnosis not present

## 2020-05-21 DIAGNOSIS — N529 Male erectile dysfunction, unspecified: Secondary | ICD-10-CM | POA: Diagnosis not present

## 2020-05-21 DIAGNOSIS — Z1331 Encounter for screening for depression: Secondary | ICD-10-CM | POA: Diagnosis not present

## 2020-05-21 DIAGNOSIS — E118 Type 2 diabetes mellitus with unspecified complications: Secondary | ICD-10-CM | POA: Diagnosis not present

## 2020-05-21 DIAGNOSIS — E785 Hyperlipidemia, unspecified: Secondary | ICD-10-CM | POA: Diagnosis not present

## 2020-05-21 DIAGNOSIS — N1831 Chronic kidney disease, stage 3a: Secondary | ICD-10-CM | POA: Diagnosis not present

## 2020-05-21 DIAGNOSIS — Z6836 Body mass index (BMI) 36.0-36.9, adult: Secondary | ICD-10-CM | POA: Diagnosis not present

## 2020-07-09 DIAGNOSIS — Z6836 Body mass index (BMI) 36.0-36.9, adult: Secondary | ICD-10-CM | POA: Diagnosis not present

## 2020-07-09 DIAGNOSIS — M159 Polyosteoarthritis, unspecified: Secondary | ICD-10-CM | POA: Diagnosis not present

## 2020-07-09 DIAGNOSIS — E785 Hyperlipidemia, unspecified: Secondary | ICD-10-CM | POA: Diagnosis not present

## 2020-07-09 DIAGNOSIS — N451 Epididymitis: Secondary | ICD-10-CM | POA: Diagnosis not present

## 2020-07-09 DIAGNOSIS — N529 Male erectile dysfunction, unspecified: Secondary | ICD-10-CM | POA: Diagnosis not present

## 2020-07-09 DIAGNOSIS — E291 Testicular hypofunction: Secondary | ICD-10-CM | POA: Diagnosis not present

## 2020-07-09 DIAGNOSIS — N1831 Chronic kidney disease, stage 3a: Secondary | ICD-10-CM | POA: Diagnosis not present

## 2020-07-17 DIAGNOSIS — E291 Testicular hypofunction: Secondary | ICD-10-CM | POA: Diagnosis not present

## 2020-07-17 DIAGNOSIS — I10 Essential (primary) hypertension: Secondary | ICD-10-CM | POA: Diagnosis not present

## 2020-07-17 DIAGNOSIS — E1121 Type 2 diabetes mellitus with diabetic nephropathy: Secondary | ICD-10-CM | POA: Diagnosis not present

## 2020-07-17 DIAGNOSIS — N451 Epididymitis: Secondary | ICD-10-CM | POA: Diagnosis not present

## 2020-07-17 DIAGNOSIS — Z6836 Body mass index (BMI) 36.0-36.9, adult: Secondary | ICD-10-CM | POA: Diagnosis not present

## 2020-07-17 DIAGNOSIS — M159 Polyosteoarthritis, unspecified: Secondary | ICD-10-CM | POA: Diagnosis not present

## 2020-07-17 DIAGNOSIS — N1831 Chronic kidney disease, stage 3a: Secondary | ICD-10-CM | POA: Diagnosis not present

## 2020-07-17 DIAGNOSIS — E785 Hyperlipidemia, unspecified: Secondary | ICD-10-CM | POA: Diagnosis not present

## 2020-07-17 DIAGNOSIS — N529 Male erectile dysfunction, unspecified: Secondary | ICD-10-CM | POA: Diagnosis not present

## 2020-07-23 DIAGNOSIS — I10 Essential (primary) hypertension: Secondary | ICD-10-CM | POA: Diagnosis not present

## 2020-07-23 DIAGNOSIS — N1831 Chronic kidney disease, stage 3a: Secondary | ICD-10-CM | POA: Diagnosis not present

## 2020-07-23 DIAGNOSIS — M159 Polyosteoarthritis, unspecified: Secondary | ICD-10-CM | POA: Diagnosis not present

## 2020-07-23 DIAGNOSIS — E785 Hyperlipidemia, unspecified: Secondary | ICD-10-CM | POA: Diagnosis not present

## 2020-07-23 DIAGNOSIS — E1121 Type 2 diabetes mellitus with diabetic nephropathy: Secondary | ICD-10-CM | POA: Diagnosis not present

## 2020-07-23 DIAGNOSIS — N451 Epididymitis: Secondary | ICD-10-CM | POA: Diagnosis not present

## 2020-07-23 DIAGNOSIS — Z6836 Body mass index (BMI) 36.0-36.9, adult: Secondary | ICD-10-CM | POA: Diagnosis not present

## 2020-07-23 DIAGNOSIS — N529 Male erectile dysfunction, unspecified: Secondary | ICD-10-CM | POA: Diagnosis not present

## 2020-07-23 DIAGNOSIS — E291 Testicular hypofunction: Secondary | ICD-10-CM | POA: Diagnosis not present

## 2020-08-09 DIAGNOSIS — I10 Essential (primary) hypertension: Secondary | ICD-10-CM | POA: Diagnosis not present

## 2020-08-09 DIAGNOSIS — E1121 Type 2 diabetes mellitus with diabetic nephropathy: Secondary | ICD-10-CM | POA: Diagnosis not present

## 2020-08-09 DIAGNOSIS — E785 Hyperlipidemia, unspecified: Secondary | ICD-10-CM | POA: Diagnosis not present

## 2020-08-09 DIAGNOSIS — M159 Polyosteoarthritis, unspecified: Secondary | ICD-10-CM | POA: Diagnosis not present

## 2020-08-09 DIAGNOSIS — N529 Male erectile dysfunction, unspecified: Secondary | ICD-10-CM | POA: Diagnosis not present

## 2020-08-09 DIAGNOSIS — N1831 Chronic kidney disease, stage 3a: Secondary | ICD-10-CM | POA: Diagnosis not present

## 2020-08-09 DIAGNOSIS — N451 Epididymitis: Secondary | ICD-10-CM | POA: Diagnosis not present

## 2020-08-09 DIAGNOSIS — E291 Testicular hypofunction: Secondary | ICD-10-CM | POA: Diagnosis not present

## 2020-08-09 DIAGNOSIS — E669 Obesity, unspecified: Secondary | ICD-10-CM | POA: Diagnosis not present

## 2020-08-09 DIAGNOSIS — Z6836 Body mass index (BMI) 36.0-36.9, adult: Secondary | ICD-10-CM | POA: Diagnosis not present

## 2020-08-23 DIAGNOSIS — E291 Testicular hypofunction: Secondary | ICD-10-CM | POA: Diagnosis not present

## 2020-08-23 DIAGNOSIS — E785 Hyperlipidemia, unspecified: Secondary | ICD-10-CM | POA: Diagnosis not present

## 2020-08-23 DIAGNOSIS — N451 Epididymitis: Secondary | ICD-10-CM | POA: Diagnosis not present

## 2020-08-23 DIAGNOSIS — Z6836 Body mass index (BMI) 36.0-36.9, adult: Secondary | ICD-10-CM | POA: Diagnosis not present

## 2020-08-23 DIAGNOSIS — I10 Essential (primary) hypertension: Secondary | ICD-10-CM | POA: Diagnosis not present

## 2020-08-23 DIAGNOSIS — N1831 Chronic kidney disease, stage 3a: Secondary | ICD-10-CM | POA: Diagnosis not present

## 2020-08-23 DIAGNOSIS — E1121 Type 2 diabetes mellitus with diabetic nephropathy: Secondary | ICD-10-CM | POA: Diagnosis not present

## 2020-08-23 DIAGNOSIS — M159 Polyosteoarthritis, unspecified: Secondary | ICD-10-CM | POA: Diagnosis not present

## 2020-08-23 DIAGNOSIS — N529 Male erectile dysfunction, unspecified: Secondary | ICD-10-CM | POA: Diagnosis not present

## 2020-08-27 DIAGNOSIS — H40033 Anatomical narrow angle, bilateral: Secondary | ICD-10-CM | POA: Diagnosis not present

## 2020-08-27 DIAGNOSIS — H2513 Age-related nuclear cataract, bilateral: Secondary | ICD-10-CM | POA: Diagnosis not present

## 2020-08-30 DIAGNOSIS — E785 Hyperlipidemia, unspecified: Secondary | ICD-10-CM | POA: Diagnosis not present

## 2020-08-30 DIAGNOSIS — M159 Polyosteoarthritis, unspecified: Secondary | ICD-10-CM | POA: Diagnosis not present

## 2020-08-30 DIAGNOSIS — Z6836 Body mass index (BMI) 36.0-36.9, adult: Secondary | ICD-10-CM | POA: Diagnosis not present

## 2020-08-30 DIAGNOSIS — E1121 Type 2 diabetes mellitus with diabetic nephropathy: Secondary | ICD-10-CM | POA: Diagnosis not present

## 2020-08-30 DIAGNOSIS — I10 Essential (primary) hypertension: Secondary | ICD-10-CM | POA: Diagnosis not present

## 2020-08-30 DIAGNOSIS — N529 Male erectile dysfunction, unspecified: Secondary | ICD-10-CM | POA: Diagnosis not present

## 2020-08-30 DIAGNOSIS — N1831 Chronic kidney disease, stage 3a: Secondary | ICD-10-CM | POA: Diagnosis not present

## 2020-08-30 DIAGNOSIS — N451 Epididymitis: Secondary | ICD-10-CM | POA: Diagnosis not present

## 2020-08-30 DIAGNOSIS — E291 Testicular hypofunction: Secondary | ICD-10-CM | POA: Diagnosis not present

## 2020-09-07 DIAGNOSIS — H40033 Anatomical narrow angle, bilateral: Secondary | ICD-10-CM | POA: Diagnosis not present

## 2020-09-11 DIAGNOSIS — Z79899 Other long term (current) drug therapy: Secondary | ICD-10-CM | POA: Diagnosis not present

## 2020-09-11 DIAGNOSIS — N529 Male erectile dysfunction, unspecified: Secondary | ICD-10-CM | POA: Diagnosis not present

## 2020-09-11 DIAGNOSIS — N401 Enlarged prostate with lower urinary tract symptoms: Secondary | ICD-10-CM | POA: Diagnosis not present

## 2020-09-11 DIAGNOSIS — N451 Epididymitis: Secondary | ICD-10-CM | POA: Diagnosis not present

## 2020-09-12 DIAGNOSIS — Z0000 Encounter for general adult medical examination without abnormal findings: Secondary | ICD-10-CM | POA: Diagnosis not present

## 2020-09-12 DIAGNOSIS — Z9181 History of falling: Secondary | ICD-10-CM | POA: Diagnosis not present

## 2020-09-12 DIAGNOSIS — E785 Hyperlipidemia, unspecified: Secondary | ICD-10-CM | POA: Diagnosis not present

## 2020-09-12 DIAGNOSIS — E669 Obesity, unspecified: Secondary | ICD-10-CM | POA: Diagnosis not present

## 2020-09-12 DIAGNOSIS — Z1331 Encounter for screening for depression: Secondary | ICD-10-CM | POA: Diagnosis not present

## 2020-09-14 DIAGNOSIS — M159 Polyosteoarthritis, unspecified: Secondary | ICD-10-CM | POA: Diagnosis not present

## 2020-09-14 DIAGNOSIS — Z6836 Body mass index (BMI) 36.0-36.9, adult: Secondary | ICD-10-CM | POA: Diagnosis not present

## 2020-09-14 DIAGNOSIS — E291 Testicular hypofunction: Secondary | ICD-10-CM | POA: Diagnosis not present

## 2020-09-14 DIAGNOSIS — E785 Hyperlipidemia, unspecified: Secondary | ICD-10-CM | POA: Diagnosis not present

## 2020-09-14 DIAGNOSIS — I10 Essential (primary) hypertension: Secondary | ICD-10-CM | POA: Diagnosis not present

## 2020-09-14 DIAGNOSIS — N1831 Chronic kidney disease, stage 3a: Secondary | ICD-10-CM | POA: Diagnosis not present

## 2020-09-14 DIAGNOSIS — N529 Male erectile dysfunction, unspecified: Secondary | ICD-10-CM | POA: Diagnosis not present

## 2020-09-14 DIAGNOSIS — E1121 Type 2 diabetes mellitus with diabetic nephropathy: Secondary | ICD-10-CM | POA: Diagnosis not present

## 2020-10-12 DIAGNOSIS — I10 Essential (primary) hypertension: Secondary | ICD-10-CM | POA: Diagnosis not present

## 2020-10-12 DIAGNOSIS — E1121 Type 2 diabetes mellitus with diabetic nephropathy: Secondary | ICD-10-CM | POA: Diagnosis not present

## 2020-10-12 DIAGNOSIS — Z6836 Body mass index (BMI) 36.0-36.9, adult: Secondary | ICD-10-CM | POA: Diagnosis not present

## 2020-10-12 DIAGNOSIS — M159 Polyosteoarthritis, unspecified: Secondary | ICD-10-CM | POA: Diagnosis not present

## 2020-10-12 DIAGNOSIS — E291 Testicular hypofunction: Secondary | ICD-10-CM | POA: Diagnosis not present

## 2020-10-12 DIAGNOSIS — N529 Male erectile dysfunction, unspecified: Secondary | ICD-10-CM | POA: Diagnosis not present

## 2020-10-12 DIAGNOSIS — E785 Hyperlipidemia, unspecified: Secondary | ICD-10-CM | POA: Diagnosis not present

## 2020-10-12 DIAGNOSIS — N1831 Chronic kidney disease, stage 3a: Secondary | ICD-10-CM | POA: Diagnosis not present

## 2020-10-30 DIAGNOSIS — N529 Male erectile dysfunction, unspecified: Secondary | ICD-10-CM | POA: Diagnosis not present

## 2020-10-30 DIAGNOSIS — N401 Enlarged prostate with lower urinary tract symptoms: Secondary | ICD-10-CM | POA: Diagnosis not present

## 2020-10-30 DIAGNOSIS — Z125 Encounter for screening for malignant neoplasm of prostate: Secondary | ICD-10-CM | POA: Diagnosis not present

## 2020-10-30 DIAGNOSIS — N451 Epididymitis: Secondary | ICD-10-CM | POA: Diagnosis not present

## 2020-11-12 DIAGNOSIS — I10 Essential (primary) hypertension: Secondary | ICD-10-CM | POA: Diagnosis not present

## 2020-11-12 DIAGNOSIS — Z6835 Body mass index (BMI) 35.0-35.9, adult: Secondary | ICD-10-CM | POA: Diagnosis not present

## 2020-11-12 DIAGNOSIS — M7711 Lateral epicondylitis, right elbow: Secondary | ICD-10-CM | POA: Diagnosis not present

## 2020-11-12 DIAGNOSIS — N1831 Chronic kidney disease, stage 3a: Secondary | ICD-10-CM | POA: Diagnosis not present

## 2020-11-12 DIAGNOSIS — E1121 Type 2 diabetes mellitus with diabetic nephropathy: Secondary | ICD-10-CM | POA: Diagnosis not present

## 2020-11-12 DIAGNOSIS — E291 Testicular hypofunction: Secondary | ICD-10-CM | POA: Diagnosis not present

## 2020-11-12 DIAGNOSIS — M159 Polyosteoarthritis, unspecified: Secondary | ICD-10-CM | POA: Diagnosis not present

## 2020-11-12 DIAGNOSIS — E785 Hyperlipidemia, unspecified: Secondary | ICD-10-CM | POA: Diagnosis not present

## 2020-11-12 DIAGNOSIS — N529 Male erectile dysfunction, unspecified: Secondary | ICD-10-CM | POA: Diagnosis not present

## 2020-11-19 DIAGNOSIS — I10 Essential (primary) hypertension: Secondary | ICD-10-CM | POA: Diagnosis not present

## 2020-11-19 DIAGNOSIS — N1831 Chronic kidney disease, stage 3a: Secondary | ICD-10-CM | POA: Diagnosis not present

## 2020-11-19 DIAGNOSIS — M7711 Lateral epicondylitis, right elbow: Secondary | ICD-10-CM | POA: Diagnosis not present

## 2020-11-19 DIAGNOSIS — N529 Male erectile dysfunction, unspecified: Secondary | ICD-10-CM | POA: Diagnosis not present

## 2020-11-19 DIAGNOSIS — E785 Hyperlipidemia, unspecified: Secondary | ICD-10-CM | POA: Diagnosis not present

## 2020-11-19 DIAGNOSIS — E1121 Type 2 diabetes mellitus with diabetic nephropathy: Secondary | ICD-10-CM | POA: Diagnosis not present

## 2020-11-19 DIAGNOSIS — Z6835 Body mass index (BMI) 35.0-35.9, adult: Secondary | ICD-10-CM | POA: Diagnosis not present

## 2020-11-19 DIAGNOSIS — M159 Polyosteoarthritis, unspecified: Secondary | ICD-10-CM | POA: Diagnosis not present

## 2020-11-19 DIAGNOSIS — E291 Testicular hypofunction: Secondary | ICD-10-CM | POA: Diagnosis not present

## 2020-12-04 DIAGNOSIS — I10 Essential (primary) hypertension: Secondary | ICD-10-CM | POA: Diagnosis not present

## 2020-12-04 DIAGNOSIS — E291 Testicular hypofunction: Secondary | ICD-10-CM | POA: Diagnosis not present

## 2020-12-04 DIAGNOSIS — N1831 Chronic kidney disease, stage 3a: Secondary | ICD-10-CM | POA: Diagnosis not present

## 2020-12-04 DIAGNOSIS — E1121 Type 2 diabetes mellitus with diabetic nephropathy: Secondary | ICD-10-CM | POA: Diagnosis not present

## 2020-12-04 DIAGNOSIS — Z6835 Body mass index (BMI) 35.0-35.9, adult: Secondary | ICD-10-CM | POA: Diagnosis not present

## 2020-12-04 DIAGNOSIS — M159 Polyosteoarthritis, unspecified: Secondary | ICD-10-CM | POA: Diagnosis not present

## 2020-12-04 DIAGNOSIS — E785 Hyperlipidemia, unspecified: Secondary | ICD-10-CM | POA: Diagnosis not present

## 2020-12-04 DIAGNOSIS — N529 Male erectile dysfunction, unspecified: Secondary | ICD-10-CM | POA: Diagnosis not present

## 2021-01-01 DIAGNOSIS — E1121 Type 2 diabetes mellitus with diabetic nephropathy: Secondary | ICD-10-CM | POA: Diagnosis not present

## 2021-01-01 DIAGNOSIS — E785 Hyperlipidemia, unspecified: Secondary | ICD-10-CM | POA: Diagnosis not present

## 2021-01-01 DIAGNOSIS — N529 Male erectile dysfunction, unspecified: Secondary | ICD-10-CM | POA: Diagnosis not present

## 2021-01-01 DIAGNOSIS — E291 Testicular hypofunction: Secondary | ICD-10-CM | POA: Diagnosis not present

## 2021-01-01 DIAGNOSIS — N1831 Chronic kidney disease, stage 3a: Secondary | ICD-10-CM | POA: Diagnosis not present

## 2021-01-01 DIAGNOSIS — M159 Polyosteoarthritis, unspecified: Secondary | ICD-10-CM | POA: Diagnosis not present

## 2021-01-01 DIAGNOSIS — Z6836 Body mass index (BMI) 36.0-36.9, adult: Secondary | ICD-10-CM | POA: Diagnosis not present

## 2021-01-01 DIAGNOSIS — I10 Essential (primary) hypertension: Secondary | ICD-10-CM | POA: Diagnosis not present

## 2021-01-01 DIAGNOSIS — M7711 Lateral epicondylitis, right elbow: Secondary | ICD-10-CM | POA: Diagnosis not present

## 2021-01-15 DIAGNOSIS — E1121 Type 2 diabetes mellitus with diabetic nephropathy: Secondary | ICD-10-CM | POA: Diagnosis not present

## 2021-01-15 DIAGNOSIS — Z6836 Body mass index (BMI) 36.0-36.9, adult: Secondary | ICD-10-CM | POA: Diagnosis not present

## 2021-01-15 DIAGNOSIS — M159 Polyosteoarthritis, unspecified: Secondary | ICD-10-CM | POA: Diagnosis not present

## 2021-01-15 DIAGNOSIS — E785 Hyperlipidemia, unspecified: Secondary | ICD-10-CM | POA: Diagnosis not present

## 2021-01-15 DIAGNOSIS — N1831 Chronic kidney disease, stage 3a: Secondary | ICD-10-CM | POA: Diagnosis not present

## 2021-01-15 DIAGNOSIS — N529 Male erectile dysfunction, unspecified: Secondary | ICD-10-CM | POA: Diagnosis not present

## 2021-01-15 DIAGNOSIS — I10 Essential (primary) hypertension: Secondary | ICD-10-CM | POA: Diagnosis not present

## 2021-01-15 DIAGNOSIS — E291 Testicular hypofunction: Secondary | ICD-10-CM | POA: Diagnosis not present

## 2021-01-21 IMAGING — MR MR MRA HEAD W/O CM
1 series · 25 of 48 positions shown · non-contrast
Comparison: Prior head CT from 11/25/2019.

CLINICAL DATA: Initial evaluation for transient ischemic attack.

EXAM:
MRI HEAD WITHOUT CONTRAST
MRA HEAD WITHOUT CONTRAST
TECHNIQUE: Multiplanar, multiecho pulse sequences of the brain and surrounding
structures were obtained without intravenous contrast. Angiographic
images of the head were obtained using MRA technique without
contrast.

[Series 9: [id number]_([id number]) · axial · 0.5mm · 0.48mm/px · z∈[-100,-10]mm · 25 of 200 slices shown]
[im 1/200]
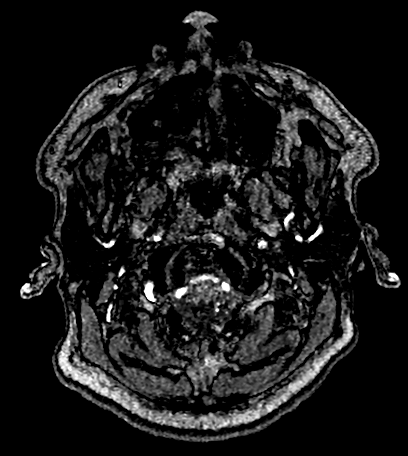
[im 5/200]
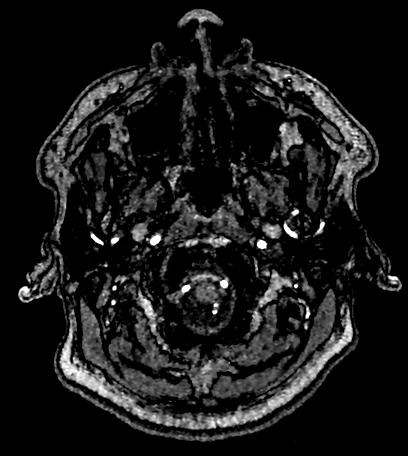
[im 9/200]
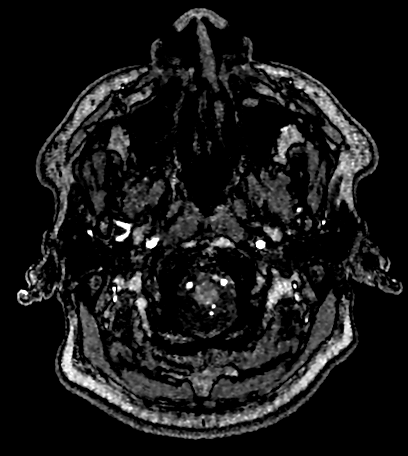
[im 13/200]
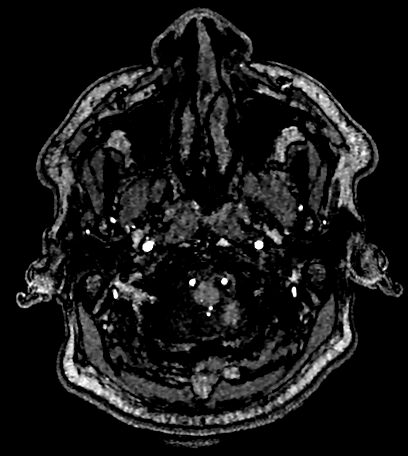
[im 17/200]
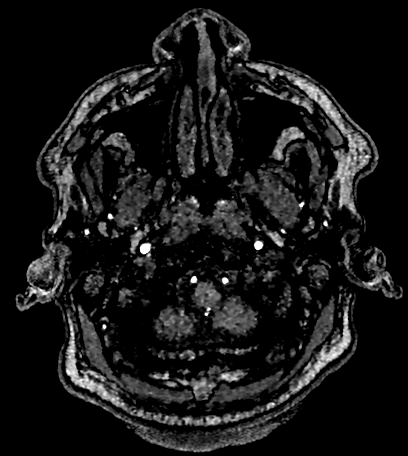
[im 22/200]
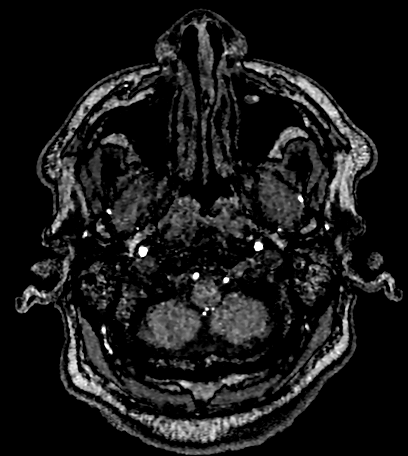
[im 26/200]
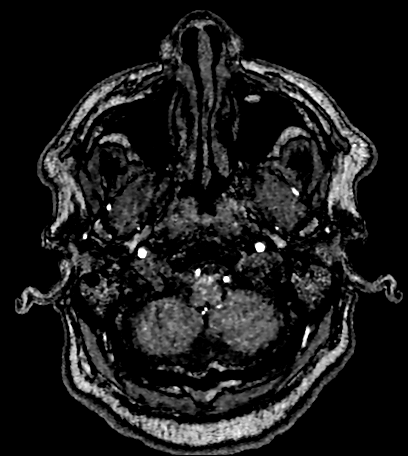
[im 30/200]
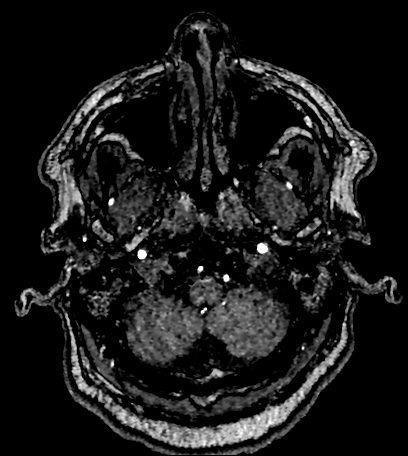
[im 34/200]
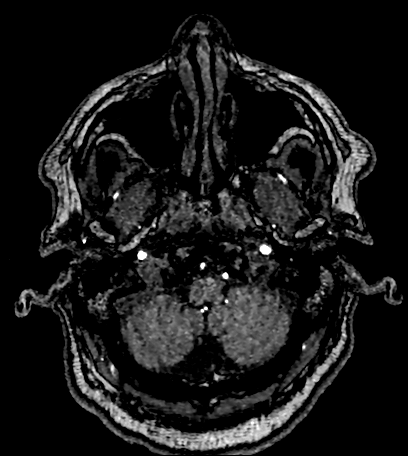
[im 39/200]
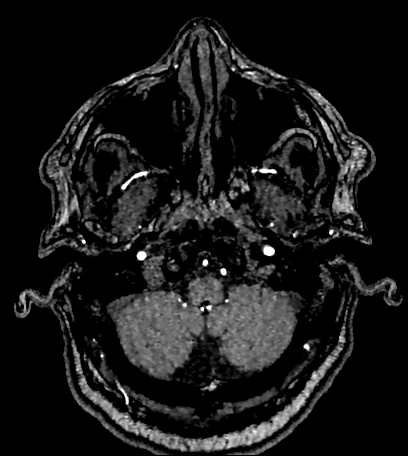
[im 43/200]
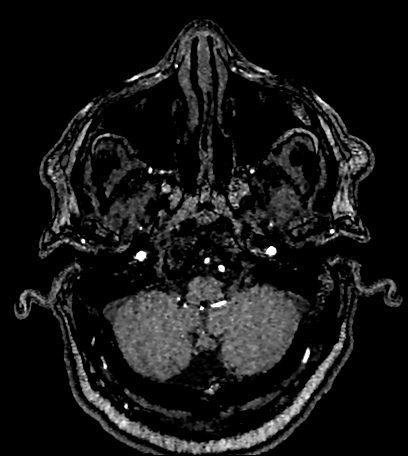
[im 47/200]
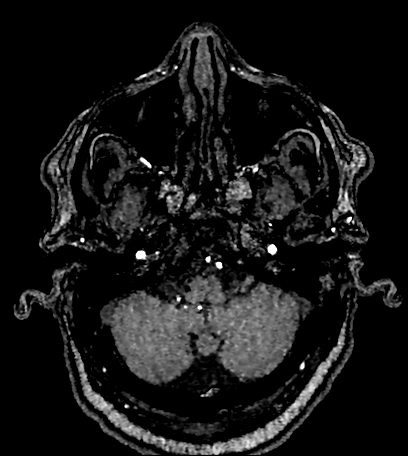
[im 51/200]
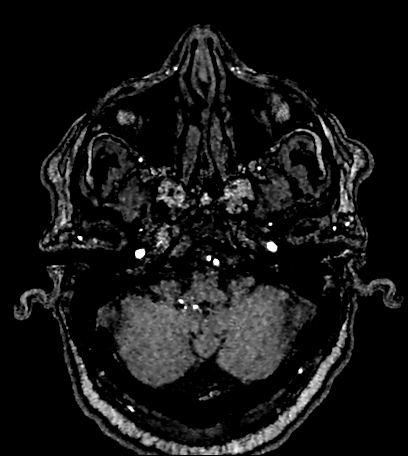
[im 56/200]
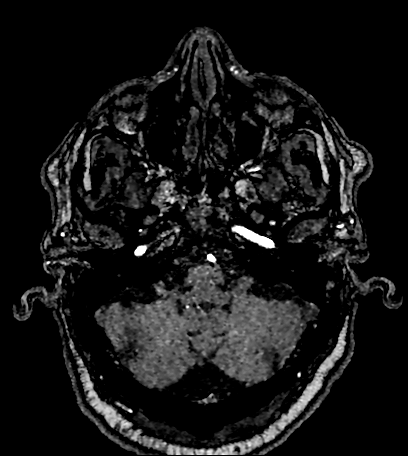
[im 60/200]
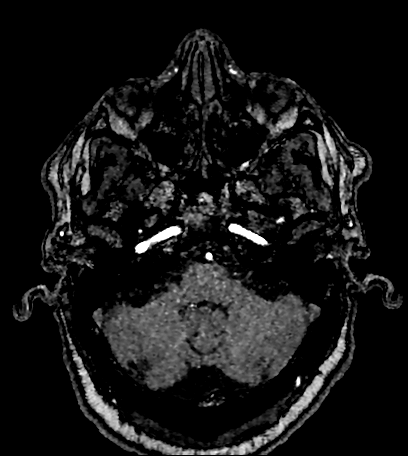
[im 64/200]
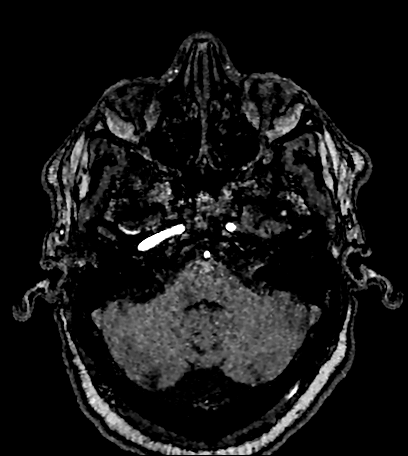
[im 68/200]
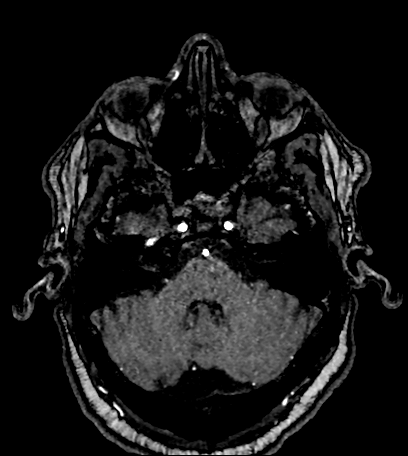
[im 72/200]
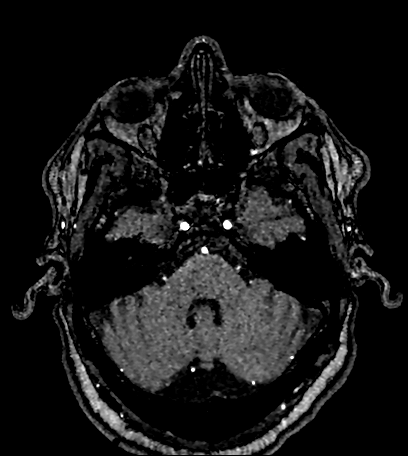
[im 89/200]
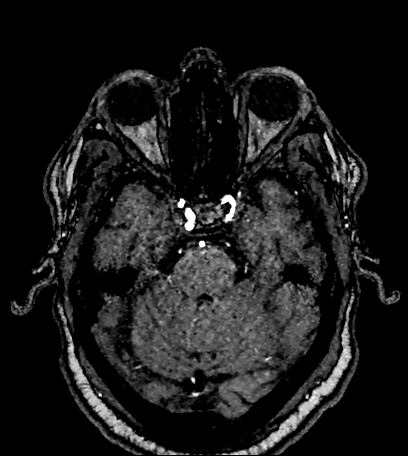
[im 102/200]
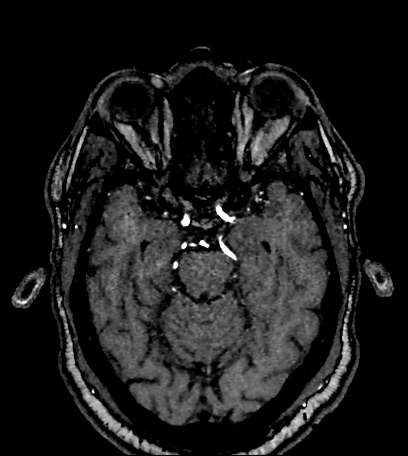
[im 115/200]
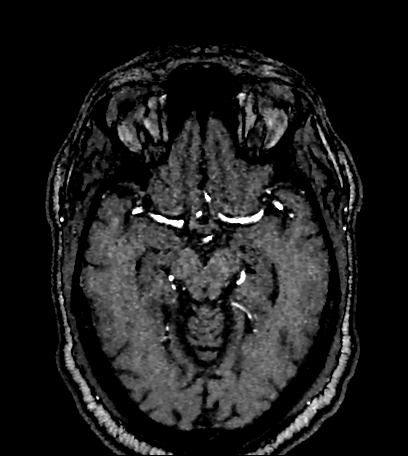
[im 140/200]
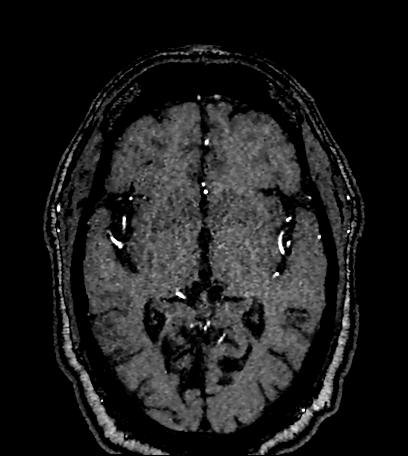
[im 166/200]
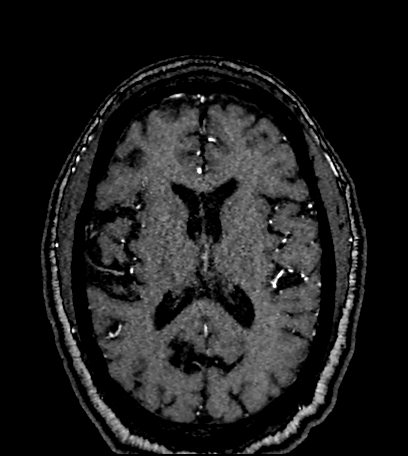
[im 170/200]
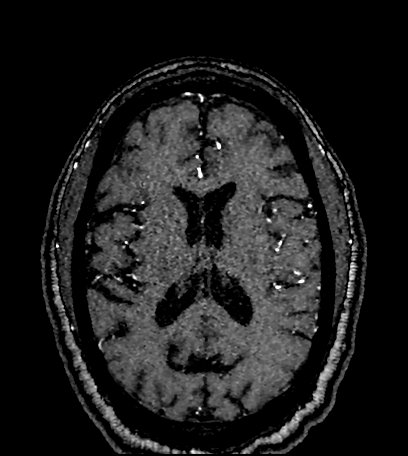
[im 191/200]
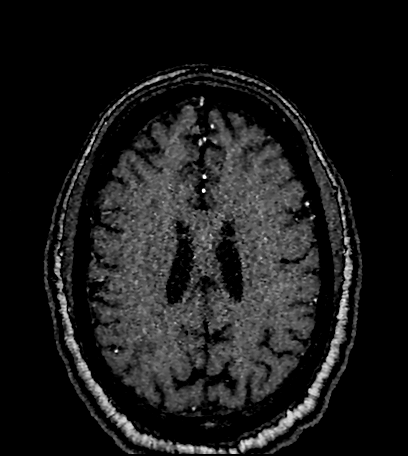

[25 of 48 positions shown; findings below may reference images not displayed]

FINDINGS: MRI HEAD FINDINGS

Brain: Cerebral volume within normal limits for patient age. Few
scattered foci of subcentimeter T2/FLAIR hyperintensity noted
involving the periventricular deep white matter both cerebral
hemispheres, nonspecific, but felt to be within normal limits for
age.

No abnormal foci of restricted diffusion to suggest acute or
subacute ischemia. Gray-white matter differentiation well
maintained. No encephalomalacia to suggest chronic infarction. No
foci of susceptibility artifact to suggest acute or chronic
intracranial hemorrhage.

No mass lesion, midline shift or mass effect. No hydrocephalus. No
extra-axial fluid collection. Major dural sinuses are grossly
patent.

Pituitary gland and suprasellar region are normal. Midline
structures intact and normal.

Vascular: Major intracranial vascular flow voids well maintained and
normal in appearance.

Skull and upper cervical spine: Craniocervical junction normal.
Visualized upper cervical spine within normal limits. Bone marrow
signal intensity normal. No scalp soft tissue abnormality.

Sinuses/Orbits: Globes and orbital soft tissues within normal
limits.

Mild scattered mucosal thickening noted throughout the ethmoidal air
cells and maxillary sinuses. Paranasal sinuses are otherwise clear.
No mastoid effusion. Inner ear structures normal.

Other: None.

MRA HEAD FINDINGS

ANTERIOR CIRCULATION:

Visualized distal cervical segments of the internal carotid arteries
are widely patent with symmetric antegrade flow. Petrous, cavernous,
and supraclinoid ICAs widely patent without stenosis or other
abnormality. A1 segments widely patent. Normal anterior
communicating artery complex. Anterior cerebral arteries widely
patent to their distal aspects without stenosis. No M1 stenosis or
occlusion. Normal MCA bifurcations. Distal MCA branches well
perfused and symmetric.

POSTERIOR CIRCULATION:

Both vertebral arteries widely patent to the vertebrobasilar
junction. Neither PICA origin visualized. Basilar widely patent to
its distal aspect without stenosis. Superior cerebral arteries
patent bilaterally. Both PCAs primarily supplied via the basilar and
are well perfused to their distal aspects.

No intracranial aneurysm.
IMPRESSION: 1. Normal brain MRI for age. No acute intracranial abnormality
identified.
2. Normal intracranial MRA.

## 2021-02-12 DIAGNOSIS — E785 Hyperlipidemia, unspecified: Secondary | ICD-10-CM | POA: Diagnosis not present

## 2021-02-12 DIAGNOSIS — I10 Essential (primary) hypertension: Secondary | ICD-10-CM | POA: Diagnosis not present

## 2021-02-12 DIAGNOSIS — M159 Polyosteoarthritis, unspecified: Secondary | ICD-10-CM | POA: Diagnosis not present

## 2021-02-12 DIAGNOSIS — M7711 Lateral epicondylitis, right elbow: Secondary | ICD-10-CM | POA: Diagnosis not present

## 2021-02-12 DIAGNOSIS — N1831 Chronic kidney disease, stage 3a: Secondary | ICD-10-CM | POA: Diagnosis not present

## 2021-02-12 DIAGNOSIS — E291 Testicular hypofunction: Secondary | ICD-10-CM | POA: Diagnosis not present

## 2021-02-12 DIAGNOSIS — N529 Male erectile dysfunction, unspecified: Secondary | ICD-10-CM | POA: Diagnosis not present

## 2021-02-12 DIAGNOSIS — E1121 Type 2 diabetes mellitus with diabetic nephropathy: Secondary | ICD-10-CM | POA: Diagnosis not present

## 2021-02-12 DIAGNOSIS — Z6835 Body mass index (BMI) 35.0-35.9, adult: Secondary | ICD-10-CM | POA: Diagnosis not present

## 2021-03-14 DIAGNOSIS — M159 Polyosteoarthritis, unspecified: Secondary | ICD-10-CM | POA: Diagnosis not present

## 2021-03-14 DIAGNOSIS — E291 Testicular hypofunction: Secondary | ICD-10-CM | POA: Diagnosis not present

## 2021-03-14 DIAGNOSIS — N1831 Chronic kidney disease, stage 3a: Secondary | ICD-10-CM | POA: Diagnosis not present

## 2021-03-14 DIAGNOSIS — Z6836 Body mass index (BMI) 36.0-36.9, adult: Secondary | ICD-10-CM | POA: Diagnosis not present

## 2021-03-14 DIAGNOSIS — E1121 Type 2 diabetes mellitus with diabetic nephropathy: Secondary | ICD-10-CM | POA: Diagnosis not present

## 2021-03-14 DIAGNOSIS — E785 Hyperlipidemia, unspecified: Secondary | ICD-10-CM | POA: Diagnosis not present

## 2021-03-14 DIAGNOSIS — N529 Male erectile dysfunction, unspecified: Secondary | ICD-10-CM | POA: Diagnosis not present

## 2021-03-14 DIAGNOSIS — I10 Essential (primary) hypertension: Secondary | ICD-10-CM | POA: Diagnosis not present

## 2021-03-14 DIAGNOSIS — M7711 Lateral epicondylitis, right elbow: Secondary | ICD-10-CM | POA: Diagnosis not present

## 2021-03-28 DIAGNOSIS — M159 Polyosteoarthritis, unspecified: Secondary | ICD-10-CM | POA: Diagnosis not present

## 2021-03-28 DIAGNOSIS — E785 Hyperlipidemia, unspecified: Secondary | ICD-10-CM | POA: Diagnosis not present

## 2021-03-28 DIAGNOSIS — N529 Male erectile dysfunction, unspecified: Secondary | ICD-10-CM | POA: Diagnosis not present

## 2021-03-28 DIAGNOSIS — Z6836 Body mass index (BMI) 36.0-36.9, adult: Secondary | ICD-10-CM | POA: Diagnosis not present

## 2021-03-28 DIAGNOSIS — E1121 Type 2 diabetes mellitus with diabetic nephropathy: Secondary | ICD-10-CM | POA: Diagnosis not present

## 2021-03-28 DIAGNOSIS — N1831 Chronic kidney disease, stage 3a: Secondary | ICD-10-CM | POA: Diagnosis not present

## 2021-03-28 DIAGNOSIS — Z6835 Body mass index (BMI) 35.0-35.9, adult: Secondary | ICD-10-CM | POA: Diagnosis not present

## 2021-03-28 DIAGNOSIS — E291 Testicular hypofunction: Secondary | ICD-10-CM | POA: Diagnosis not present

## 2021-03-28 DIAGNOSIS — I10 Essential (primary) hypertension: Secondary | ICD-10-CM | POA: Diagnosis not present

## 2021-05-01 DIAGNOSIS — N401 Enlarged prostate with lower urinary tract symptoms: Secondary | ICD-10-CM | POA: Diagnosis not present

## 2021-05-01 DIAGNOSIS — R079 Chest pain, unspecified: Secondary | ICD-10-CM | POA: Diagnosis not present

## 2021-05-01 DIAGNOSIS — N529 Male erectile dysfunction, unspecified: Secondary | ICD-10-CM | POA: Diagnosis not present

## 2021-05-10 DIAGNOSIS — N529 Male erectile dysfunction, unspecified: Secondary | ICD-10-CM | POA: Diagnosis not present

## 2021-05-10 DIAGNOSIS — Z23 Encounter for immunization: Secondary | ICD-10-CM | POA: Diagnosis not present

## 2021-05-10 DIAGNOSIS — Z6835 Body mass index (BMI) 35.0-35.9, adult: Secondary | ICD-10-CM | POA: Diagnosis not present

## 2021-05-10 DIAGNOSIS — E1121 Type 2 diabetes mellitus with diabetic nephropathy: Secondary | ICD-10-CM | POA: Diagnosis not present

## 2021-05-10 DIAGNOSIS — M159 Polyosteoarthritis, unspecified: Secondary | ICD-10-CM | POA: Diagnosis not present

## 2021-05-10 DIAGNOSIS — I10 Essential (primary) hypertension: Secondary | ICD-10-CM | POA: Diagnosis not present

## 2021-05-10 DIAGNOSIS — E291 Testicular hypofunction: Secondary | ICD-10-CM | POA: Diagnosis not present

## 2021-05-10 DIAGNOSIS — E785 Hyperlipidemia, unspecified: Secondary | ICD-10-CM | POA: Diagnosis not present

## 2021-05-10 DIAGNOSIS — N1831 Chronic kidney disease, stage 3a: Secondary | ICD-10-CM | POA: Diagnosis not present

## 2021-05-16 DIAGNOSIS — Z6836 Body mass index (BMI) 36.0-36.9, adult: Secondary | ICD-10-CM | POA: Diagnosis not present

## 2021-05-16 DIAGNOSIS — M159 Polyosteoarthritis, unspecified: Secondary | ICD-10-CM | POA: Diagnosis not present

## 2021-05-16 DIAGNOSIS — E1121 Type 2 diabetes mellitus with diabetic nephropathy: Secondary | ICD-10-CM | POA: Diagnosis not present

## 2021-05-16 DIAGNOSIS — N1831 Chronic kidney disease, stage 3a: Secondary | ICD-10-CM | POA: Diagnosis not present

## 2021-05-16 DIAGNOSIS — E785 Hyperlipidemia, unspecified: Secondary | ICD-10-CM | POA: Diagnosis not present

## 2021-05-16 DIAGNOSIS — E291 Testicular hypofunction: Secondary | ICD-10-CM | POA: Diagnosis not present

## 2021-05-16 DIAGNOSIS — N529 Male erectile dysfunction, unspecified: Secondary | ICD-10-CM | POA: Diagnosis not present

## 2021-05-16 DIAGNOSIS — I10 Essential (primary) hypertension: Secondary | ICD-10-CM | POA: Diagnosis not present

## 2021-08-16 DIAGNOSIS — N1831 Chronic kidney disease, stage 3a: Secondary | ICD-10-CM | POA: Diagnosis not present

## 2021-08-16 DIAGNOSIS — I10 Essential (primary) hypertension: Secondary | ICD-10-CM | POA: Diagnosis not present

## 2021-08-16 DIAGNOSIS — E291 Testicular hypofunction: Secondary | ICD-10-CM | POA: Diagnosis not present

## 2021-08-16 DIAGNOSIS — E785 Hyperlipidemia, unspecified: Secondary | ICD-10-CM | POA: Diagnosis not present

## 2021-08-16 DIAGNOSIS — Z6836 Body mass index (BMI) 36.0-36.9, adult: Secondary | ICD-10-CM | POA: Diagnosis not present

## 2021-08-16 DIAGNOSIS — M159 Polyosteoarthritis, unspecified: Secondary | ICD-10-CM | POA: Diagnosis not present

## 2021-08-16 DIAGNOSIS — N529 Male erectile dysfunction, unspecified: Secondary | ICD-10-CM | POA: Diagnosis not present

## 2021-08-16 DIAGNOSIS — E1121 Type 2 diabetes mellitus with diabetic nephropathy: Secondary | ICD-10-CM | POA: Diagnosis not present

## 2021-09-03 DIAGNOSIS — N401 Enlarged prostate with lower urinary tract symptoms: Secondary | ICD-10-CM | POA: Diagnosis not present

## 2021-09-03 DIAGNOSIS — N529 Male erectile dysfunction, unspecified: Secondary | ICD-10-CM | POA: Diagnosis not present

## 2021-09-17 DIAGNOSIS — Z6836 Body mass index (BMI) 36.0-36.9, adult: Secondary | ICD-10-CM | POA: Diagnosis not present

## 2021-09-17 DIAGNOSIS — Z0000 Encounter for general adult medical examination without abnormal findings: Secondary | ICD-10-CM | POA: Diagnosis not present

## 2021-09-17 DIAGNOSIS — E785 Hyperlipidemia, unspecified: Secondary | ICD-10-CM | POA: Diagnosis not present

## 2021-09-17 DIAGNOSIS — Z9181 History of falling: Secondary | ICD-10-CM | POA: Diagnosis not present

## 2021-09-17 DIAGNOSIS — Z1331 Encounter for screening for depression: Secondary | ICD-10-CM | POA: Diagnosis not present

## 2021-09-17 DIAGNOSIS — E669 Obesity, unspecified: Secondary | ICD-10-CM | POA: Diagnosis not present

## 2021-10-03 DIAGNOSIS — I10 Essential (primary) hypertension: Secondary | ICD-10-CM | POA: Diagnosis not present

## 2021-10-03 DIAGNOSIS — L0390 Cellulitis, unspecified: Secondary | ICD-10-CM | POA: Diagnosis not present

## 2021-10-03 DIAGNOSIS — Z6836 Body mass index (BMI) 36.0-36.9, adult: Secondary | ICD-10-CM | POA: Diagnosis not present

## 2021-10-03 DIAGNOSIS — E1121 Type 2 diabetes mellitus with diabetic nephropathy: Secondary | ICD-10-CM | POA: Diagnosis not present

## 2021-10-03 DIAGNOSIS — E291 Testicular hypofunction: Secondary | ICD-10-CM | POA: Diagnosis not present

## 2021-10-03 DIAGNOSIS — M159 Polyosteoarthritis, unspecified: Secondary | ICD-10-CM | POA: Diagnosis not present

## 2021-10-03 DIAGNOSIS — E785 Hyperlipidemia, unspecified: Secondary | ICD-10-CM | POA: Diagnosis not present

## 2021-10-03 DIAGNOSIS — N529 Male erectile dysfunction, unspecified: Secondary | ICD-10-CM | POA: Diagnosis not present

## 2021-10-03 DIAGNOSIS — N1831 Chronic kidney disease, stage 3a: Secondary | ICD-10-CM | POA: Diagnosis not present

## 2021-10-17 DIAGNOSIS — M159 Polyosteoarthritis, unspecified: Secondary | ICD-10-CM | POA: Diagnosis not present

## 2021-10-17 DIAGNOSIS — N1831 Chronic kidney disease, stage 3a: Secondary | ICD-10-CM | POA: Diagnosis not present

## 2021-10-17 DIAGNOSIS — L0390 Cellulitis, unspecified: Secondary | ICD-10-CM | POA: Diagnosis not present

## 2021-10-17 DIAGNOSIS — E1121 Type 2 diabetes mellitus with diabetic nephropathy: Secondary | ICD-10-CM | POA: Diagnosis not present

## 2021-10-17 DIAGNOSIS — E785 Hyperlipidemia, unspecified: Secondary | ICD-10-CM | POA: Diagnosis not present

## 2021-10-17 DIAGNOSIS — E291 Testicular hypofunction: Secondary | ICD-10-CM | POA: Diagnosis not present

## 2021-10-17 DIAGNOSIS — N529 Male erectile dysfunction, unspecified: Secondary | ICD-10-CM | POA: Diagnosis not present

## 2021-10-17 DIAGNOSIS — Z6836 Body mass index (BMI) 36.0-36.9, adult: Secondary | ICD-10-CM | POA: Diagnosis not present

## 2021-10-17 DIAGNOSIS — I10 Essential (primary) hypertension: Secondary | ICD-10-CM | POA: Diagnosis not present

## 2021-10-30 DIAGNOSIS — R7989 Other specified abnormal findings of blood chemistry: Secondary | ICD-10-CM | POA: Diagnosis not present

## 2021-11-14 DIAGNOSIS — N529 Male erectile dysfunction, unspecified: Secondary | ICD-10-CM | POA: Diagnosis not present

## 2021-11-14 DIAGNOSIS — E1121 Type 2 diabetes mellitus with diabetic nephropathy: Secondary | ICD-10-CM | POA: Diagnosis not present

## 2021-11-14 DIAGNOSIS — M159 Polyosteoarthritis, unspecified: Secondary | ICD-10-CM | POA: Diagnosis not present

## 2021-11-14 DIAGNOSIS — I10 Essential (primary) hypertension: Secondary | ICD-10-CM | POA: Diagnosis not present

## 2021-11-14 DIAGNOSIS — N1831 Chronic kidney disease, stage 3a: Secondary | ICD-10-CM | POA: Diagnosis not present

## 2021-11-14 DIAGNOSIS — M25552 Pain in left hip: Secondary | ICD-10-CM | POA: Diagnosis not present

## 2021-11-14 DIAGNOSIS — E291 Testicular hypofunction: Secondary | ICD-10-CM | POA: Diagnosis not present

## 2021-11-14 DIAGNOSIS — Z6836 Body mass index (BMI) 36.0-36.9, adult: Secondary | ICD-10-CM | POA: Diagnosis not present

## 2021-11-14 DIAGNOSIS — E785 Hyperlipidemia, unspecified: Secondary | ICD-10-CM | POA: Diagnosis not present

## 2021-11-26 DIAGNOSIS — Z1211 Encounter for screening for malignant neoplasm of colon: Secondary | ICD-10-CM | POA: Diagnosis not present

## 2021-11-26 DIAGNOSIS — Z86010 Personal history of colonic polyps: Secondary | ICD-10-CM | POA: Diagnosis not present

## 2021-11-26 DIAGNOSIS — K635 Polyp of colon: Secondary | ICD-10-CM | POA: Diagnosis not present

## 2021-11-26 DIAGNOSIS — E119 Type 2 diabetes mellitus without complications: Secondary | ICD-10-CM | POA: Diagnosis not present

## 2021-11-26 DIAGNOSIS — K621 Rectal polyp: Secondary | ICD-10-CM | POA: Diagnosis not present

## 2021-11-26 DIAGNOSIS — D127 Benign neoplasm of rectosigmoid junction: Secondary | ICD-10-CM | POA: Diagnosis not present

## 2021-11-28 DIAGNOSIS — N529 Male erectile dysfunction, unspecified: Secondary | ICD-10-CM | POA: Diagnosis not present

## 2021-11-28 DIAGNOSIS — E785 Hyperlipidemia, unspecified: Secondary | ICD-10-CM | POA: Diagnosis not present

## 2021-11-28 DIAGNOSIS — E291 Testicular hypofunction: Secondary | ICD-10-CM | POA: Diagnosis not present

## 2021-11-28 DIAGNOSIS — M159 Polyosteoarthritis, unspecified: Secondary | ICD-10-CM | POA: Diagnosis not present

## 2021-11-28 DIAGNOSIS — Z6835 Body mass index (BMI) 35.0-35.9, adult: Secondary | ICD-10-CM | POA: Diagnosis not present

## 2021-11-28 DIAGNOSIS — I10 Essential (primary) hypertension: Secondary | ICD-10-CM | POA: Diagnosis not present

## 2021-11-28 DIAGNOSIS — E1121 Type 2 diabetes mellitus with diabetic nephropathy: Secondary | ICD-10-CM | POA: Diagnosis not present

## 2021-11-28 DIAGNOSIS — N1831 Chronic kidney disease, stage 3a: Secondary | ICD-10-CM | POA: Diagnosis not present

## 2022-01-28 DIAGNOSIS — E1121 Type 2 diabetes mellitus with diabetic nephropathy: Secondary | ICD-10-CM | POA: Diagnosis not present

## 2022-01-28 DIAGNOSIS — E785 Hyperlipidemia, unspecified: Secondary | ICD-10-CM | POA: Diagnosis not present

## 2022-01-28 DIAGNOSIS — I10 Essential (primary) hypertension: Secondary | ICD-10-CM | POA: Diagnosis not present

## 2022-01-28 DIAGNOSIS — Z6835 Body mass index (BMI) 35.0-35.9, adult: Secondary | ICD-10-CM | POA: Diagnosis not present

## 2022-01-28 DIAGNOSIS — M159 Polyosteoarthritis, unspecified: Secondary | ICD-10-CM | POA: Diagnosis not present

## 2022-01-28 DIAGNOSIS — N1831 Chronic kidney disease, stage 3a: Secondary | ICD-10-CM | POA: Diagnosis not present

## 2022-01-28 DIAGNOSIS — N529 Male erectile dysfunction, unspecified: Secondary | ICD-10-CM | POA: Diagnosis not present

## 2022-01-28 DIAGNOSIS — E291 Testicular hypofunction: Secondary | ICD-10-CM | POA: Diagnosis not present

## 2022-03-10 DIAGNOSIS — E785 Hyperlipidemia, unspecified: Secondary | ICD-10-CM | POA: Diagnosis not present

## 2022-03-10 DIAGNOSIS — R5382 Chronic fatigue, unspecified: Secondary | ICD-10-CM | POA: Diagnosis not present

## 2022-03-10 DIAGNOSIS — M159 Polyosteoarthritis, unspecified: Secondary | ICD-10-CM | POA: Diagnosis not present

## 2022-03-10 DIAGNOSIS — N1831 Chronic kidney disease, stage 3a: Secondary | ICD-10-CM | POA: Diagnosis not present

## 2022-03-10 DIAGNOSIS — N529 Male erectile dysfunction, unspecified: Secondary | ICD-10-CM | POA: Diagnosis not present

## 2022-03-10 DIAGNOSIS — E1121 Type 2 diabetes mellitus with diabetic nephropathy: Secondary | ICD-10-CM | POA: Diagnosis not present

## 2022-03-10 DIAGNOSIS — E291 Testicular hypofunction: Secondary | ICD-10-CM | POA: Diagnosis not present

## 2022-03-10 DIAGNOSIS — I10 Essential (primary) hypertension: Secondary | ICD-10-CM | POA: Diagnosis not present

## 2022-03-10 DIAGNOSIS — N400 Enlarged prostate without lower urinary tract symptoms: Secondary | ICD-10-CM | POA: Diagnosis not present

## 2022-03-10 DIAGNOSIS — Z6836 Body mass index (BMI) 36.0-36.9, adult: Secondary | ICD-10-CM | POA: Diagnosis not present

## 2022-03-26 DIAGNOSIS — E291 Testicular hypofunction: Secondary | ICD-10-CM | POA: Diagnosis not present

## 2022-03-26 DIAGNOSIS — I10 Essential (primary) hypertension: Secondary | ICD-10-CM | POA: Diagnosis not present

## 2022-03-26 DIAGNOSIS — N1831 Chronic kidney disease, stage 3a: Secondary | ICD-10-CM | POA: Diagnosis not present

## 2022-03-26 DIAGNOSIS — E1121 Type 2 diabetes mellitus with diabetic nephropathy: Secondary | ICD-10-CM | POA: Diagnosis not present

## 2022-03-26 DIAGNOSIS — Z0000 Encounter for general adult medical examination without abnormal findings: Secondary | ICD-10-CM | POA: Diagnosis not present

## 2022-03-26 DIAGNOSIS — N400 Enlarged prostate without lower urinary tract symptoms: Secondary | ICD-10-CM | POA: Diagnosis not present

## 2022-03-26 DIAGNOSIS — Z6836 Body mass index (BMI) 36.0-36.9, adult: Secondary | ICD-10-CM | POA: Diagnosis not present

## 2022-03-26 DIAGNOSIS — M159 Polyosteoarthritis, unspecified: Secondary | ICD-10-CM | POA: Diagnosis not present

## 2022-03-26 DIAGNOSIS — E785 Hyperlipidemia, unspecified: Secondary | ICD-10-CM | POA: Diagnosis not present

## 2022-03-26 DIAGNOSIS — N529 Male erectile dysfunction, unspecified: Secondary | ICD-10-CM | POA: Diagnosis not present

## 2022-03-26 DIAGNOSIS — Z125 Encounter for screening for malignant neoplasm of prostate: Secondary | ICD-10-CM | POA: Diagnosis not present

## 2022-05-09 DIAGNOSIS — Z23 Encounter for immunization: Secondary | ICD-10-CM | POA: Diagnosis not present

## 2022-05-09 DIAGNOSIS — Z6837 Body mass index (BMI) 37.0-37.9, adult: Secondary | ICD-10-CM | POA: Diagnosis not present

## 2022-05-09 DIAGNOSIS — N400 Enlarged prostate without lower urinary tract symptoms: Secondary | ICD-10-CM | POA: Diagnosis not present

## 2022-05-09 DIAGNOSIS — E291 Testicular hypofunction: Secondary | ICD-10-CM | POA: Diagnosis not present

## 2022-05-09 DIAGNOSIS — E1121 Type 2 diabetes mellitus with diabetic nephropathy: Secondary | ICD-10-CM | POA: Diagnosis not present

## 2022-05-09 DIAGNOSIS — N1831 Chronic kidney disease, stage 3a: Secondary | ICD-10-CM | POA: Diagnosis not present

## 2022-05-09 DIAGNOSIS — E785 Hyperlipidemia, unspecified: Secondary | ICD-10-CM | POA: Diagnosis not present

## 2022-05-09 DIAGNOSIS — I10 Essential (primary) hypertension: Secondary | ICD-10-CM | POA: Diagnosis not present

## 2022-05-09 DIAGNOSIS — M159 Polyosteoarthritis, unspecified: Secondary | ICD-10-CM | POA: Diagnosis not present

## 2022-05-09 DIAGNOSIS — N529 Male erectile dysfunction, unspecified: Secondary | ICD-10-CM | POA: Diagnosis not present

## 2022-05-30 DIAGNOSIS — N400 Enlarged prostate without lower urinary tract symptoms: Secondary | ICD-10-CM | POA: Diagnosis not present

## 2022-05-30 DIAGNOSIS — Z6837 Body mass index (BMI) 37.0-37.9, adult: Secondary | ICD-10-CM | POA: Diagnosis not present

## 2022-05-30 DIAGNOSIS — E1121 Type 2 diabetes mellitus with diabetic nephropathy: Secondary | ICD-10-CM | POA: Diagnosis not present

## 2022-05-30 DIAGNOSIS — N1831 Chronic kidney disease, stage 3a: Secondary | ICD-10-CM | POA: Diagnosis not present

## 2022-05-30 DIAGNOSIS — M159 Polyosteoarthritis, unspecified: Secondary | ICD-10-CM | POA: Diagnosis not present

## 2022-05-30 DIAGNOSIS — I10 Essential (primary) hypertension: Secondary | ICD-10-CM | POA: Diagnosis not present

## 2022-05-30 DIAGNOSIS — E291 Testicular hypofunction: Secondary | ICD-10-CM | POA: Diagnosis not present

## 2022-05-30 DIAGNOSIS — E785 Hyperlipidemia, unspecified: Secondary | ICD-10-CM | POA: Diagnosis not present

## 2022-05-30 DIAGNOSIS — N529 Male erectile dysfunction, unspecified: Secondary | ICD-10-CM | POA: Diagnosis not present

## 2022-06-27 DIAGNOSIS — N400 Enlarged prostate without lower urinary tract symptoms: Secondary | ICD-10-CM | POA: Diagnosis not present

## 2022-06-27 DIAGNOSIS — N529 Male erectile dysfunction, unspecified: Secondary | ICD-10-CM | POA: Diagnosis not present

## 2022-06-27 DIAGNOSIS — E291 Testicular hypofunction: Secondary | ICD-10-CM | POA: Diagnosis not present

## 2022-06-27 DIAGNOSIS — Z6836 Body mass index (BMI) 36.0-36.9, adult: Secondary | ICD-10-CM | POA: Diagnosis not present

## 2022-06-27 DIAGNOSIS — E785 Hyperlipidemia, unspecified: Secondary | ICD-10-CM | POA: Diagnosis not present

## 2022-06-27 DIAGNOSIS — N1831 Chronic kidney disease, stage 3a: Secondary | ICD-10-CM | POA: Diagnosis not present

## 2022-06-27 DIAGNOSIS — I10 Essential (primary) hypertension: Secondary | ICD-10-CM | POA: Diagnosis not present

## 2022-06-27 DIAGNOSIS — M159 Polyosteoarthritis, unspecified: Secondary | ICD-10-CM | POA: Diagnosis not present

## 2022-06-27 DIAGNOSIS — E1121 Type 2 diabetes mellitus with diabetic nephropathy: Secondary | ICD-10-CM | POA: Diagnosis not present
